# Patient Record
Sex: Female | Born: 1980 | Race: White | Hispanic: No | Smoking: Former smoker
Health system: Southern US, Community
[De-identification: ages and names within clinical notes are randomized; demographics above are authoritative.]

## PROBLEM LIST (undated history)

## (undated) DIAGNOSIS — F17200 Nicotine dependence, unspecified, uncomplicated: Secondary | ICD-10-CM

## (undated) DIAGNOSIS — I2510 Atherosclerotic heart disease of native coronary artery without angina pectoris: Secondary | ICD-10-CM

## (undated) DIAGNOSIS — T7840XA Allergy, unspecified, initial encounter: Secondary | ICD-10-CM

## (undated) DIAGNOSIS — K900 Celiac disease: Secondary | ICD-10-CM

## (undated) DIAGNOSIS — I10 Essential (primary) hypertension: Secondary | ICD-10-CM

## (undated) HISTORY — DX: Atherosclerotic heart disease of native coronary artery without angina pectoris: I25.10

## (undated) HISTORY — DX: Essential (primary) hypertension: I10

## (undated) HISTORY — DX: Allergy, unspecified, initial encounter: T78.40XA

## (undated) HISTORY — DX: Celiac disease: K90.0

---

## 1998-06-18 ENCOUNTER — Emergency Department (HOSPITAL_COMMUNITY): Admission: EM | Admit: 1998-06-18 | Discharge: 1998-06-18 | Payer: Self-pay | Admitting: Emergency Medicine

## 1998-07-15 ENCOUNTER — Ambulatory Visit (HOSPITAL_COMMUNITY): Admission: RE | Admit: 1998-07-15 | Discharge: 1998-07-15 | Payer: Self-pay | Admitting: *Deleted

## 1998-07-20 ENCOUNTER — Inpatient Hospital Stay (HOSPITAL_COMMUNITY): Admission: AD | Admit: 1998-07-20 | Discharge: 1998-07-20 | Payer: Self-pay | Admitting: Obstetrics & Gynecology

## 1998-10-15 ENCOUNTER — Inpatient Hospital Stay (HOSPITAL_COMMUNITY): Admission: AD | Admit: 1998-10-15 | Discharge: 1998-10-15 | Payer: Self-pay | Admitting: *Deleted

## 1998-12-09 ENCOUNTER — Inpatient Hospital Stay (HOSPITAL_COMMUNITY): Admission: AD | Admit: 1998-12-09 | Discharge: 1998-12-11 | Payer: Self-pay | Admitting: Obstetrics

## 1998-12-14 ENCOUNTER — Encounter (HOSPITAL_COMMUNITY): Admission: RE | Admit: 1998-12-14 | Discharge: 1999-03-14 | Payer: Self-pay | Admitting: Obstetrics

## 2000-04-12 ENCOUNTER — Emergency Department (HOSPITAL_COMMUNITY): Admission: EM | Admit: 2000-04-12 | Discharge: 2000-04-12 | Payer: Self-pay | Admitting: Emergency Medicine

## 2000-04-12 ENCOUNTER — Encounter: Payer: Self-pay | Admitting: Emergency Medicine

## 2000-05-20 ENCOUNTER — Emergency Department (HOSPITAL_COMMUNITY): Admission: EM | Admit: 2000-05-20 | Discharge: 2000-05-20 | Payer: Self-pay | Admitting: Emergency Medicine

## 2000-08-07 ENCOUNTER — Emergency Department (HOSPITAL_COMMUNITY): Admission: EM | Admit: 2000-08-07 | Discharge: 2000-08-07 | Payer: Self-pay | Admitting: Emergency Medicine

## 2000-09-01 ENCOUNTER — Emergency Department (HOSPITAL_COMMUNITY): Admission: EM | Admit: 2000-09-01 | Discharge: 2000-09-02 | Payer: Self-pay | Admitting: Emergency Medicine

## 2001-05-06 ENCOUNTER — Emergency Department (HOSPITAL_COMMUNITY): Admission: EM | Admit: 2001-05-06 | Discharge: 2001-05-06 | Payer: Self-pay | Admitting: Emergency Medicine

## 2004-02-14 ENCOUNTER — Emergency Department (HOSPITAL_COMMUNITY): Admission: EM | Admit: 2004-02-14 | Discharge: 2004-02-14 | Payer: Self-pay | Admitting: Emergency Medicine

## 2005-09-04 ENCOUNTER — Emergency Department (HOSPITAL_COMMUNITY): Admission: EM | Admit: 2005-09-04 | Discharge: 2005-09-04 | Payer: Self-pay | Admitting: Family Medicine

## 2006-05-23 ENCOUNTER — Emergency Department (HOSPITAL_COMMUNITY): Admission: EM | Admit: 2006-05-23 | Discharge: 2006-05-23 | Payer: Self-pay | Admitting: Emergency Medicine

## 2007-07-20 ENCOUNTER — Emergency Department (HOSPITAL_COMMUNITY): Admission: EM | Admit: 2007-07-20 | Discharge: 2007-07-20 | Payer: Self-pay | Admitting: Family Medicine

## 2007-07-22 ENCOUNTER — Emergency Department (HOSPITAL_COMMUNITY): Admission: EM | Admit: 2007-07-22 | Discharge: 2007-07-22 | Payer: Self-pay | Admitting: Emergency Medicine

## 2008-01-07 ENCOUNTER — Ambulatory Visit (HOSPITAL_COMMUNITY): Payer: Self-pay | Admitting: Marriage and Family Therapist

## 2008-01-11 ENCOUNTER — Ambulatory Visit (HOSPITAL_COMMUNITY): Payer: Self-pay | Admitting: Psychiatry

## 2008-02-01 ENCOUNTER — Ambulatory Visit (HOSPITAL_COMMUNITY): Payer: Self-pay | Admitting: Psychiatry

## 2008-02-05 ENCOUNTER — Ambulatory Visit (HOSPITAL_COMMUNITY): Payer: Self-pay | Admitting: Marriage and Family Therapist

## 2008-03-31 ENCOUNTER — Ambulatory Visit (HOSPITAL_COMMUNITY): Payer: Self-pay | Admitting: Psychiatry

## 2008-04-08 ENCOUNTER — Ambulatory Visit (HOSPITAL_COMMUNITY): Payer: Self-pay | Admitting: Marriage and Family Therapist

## 2008-04-17 ENCOUNTER — Ambulatory Visit (HOSPITAL_COMMUNITY): Payer: Self-pay | Admitting: Marriage and Family Therapist

## 2008-05-16 ENCOUNTER — Ambulatory Visit (HOSPITAL_COMMUNITY): Payer: Self-pay | Admitting: Psychiatry

## 2008-06-13 ENCOUNTER — Ambulatory Visit (HOSPITAL_COMMUNITY): Payer: Self-pay | Admitting: Psychiatry

## 2008-08-13 ENCOUNTER — Ambulatory Visit (HOSPITAL_COMMUNITY): Payer: Self-pay | Admitting: Psychiatry

## 2008-09-15 ENCOUNTER — Ambulatory Visit (HOSPITAL_COMMUNITY): Payer: Self-pay | Admitting: Psychiatry

## 2008-10-06 ENCOUNTER — Ambulatory Visit (HOSPITAL_COMMUNITY): Payer: Self-pay | Admitting: Psychiatry

## 2009-01-02 ENCOUNTER — Ambulatory Visit (HOSPITAL_COMMUNITY): Payer: Self-pay | Admitting: Psychiatry

## 2009-01-23 ENCOUNTER — Ambulatory Visit (HOSPITAL_COMMUNITY): Payer: Self-pay | Admitting: Psychiatry

## 2009-02-04 ENCOUNTER — Ambulatory Visit (HOSPITAL_COMMUNITY): Payer: Self-pay | Admitting: Licensed Clinical Social Worker

## 2009-02-17 ENCOUNTER — Ambulatory Visit (HOSPITAL_COMMUNITY): Payer: Self-pay | Admitting: Licensed Clinical Social Worker

## 2009-03-25 ENCOUNTER — Ambulatory Visit (HOSPITAL_COMMUNITY): Payer: Self-pay | Admitting: Psychiatry

## 2009-06-24 ENCOUNTER — Ambulatory Visit (HOSPITAL_COMMUNITY): Payer: Self-pay | Admitting: Psychiatry

## 2010-04-05 ENCOUNTER — Ambulatory Visit: Payer: Self-pay | Admitting: Family Medicine

## 2011-08-11 LAB — CBC
HCT: 41.4
Hemoglobin: 14.2
MCHC: 34.3
MCV: 93.2
Platelets: 296
RBC: 4.45
RDW: 12.9
WBC: 9.1

## 2011-08-11 LAB — I-STAT 8, (EC8 V) (CONVERTED LAB)
Acid-Base Excess: 2
Chloride: 107
HCT: 45
Hemoglobin: 15.3 — ABNORMAL HIGH
Potassium: 3.3 — ABNORMAL LOW
Sodium: 141
TCO2: 24

## 2011-08-11 LAB — DIFFERENTIAL
Eosinophils Absolute: 0.4
Eosinophils Relative: 5
Lymphocytes Relative: 20
Lymphs Abs: 1.9
Monocytes Relative: 6
Neutrophils Relative %: 67

## 2011-08-11 LAB — POCT I-STAT CREATININE
Creatinine, Ser: 0.6
Operator id: 196461

## 2018-11-28 ENCOUNTER — Emergency Department (HOSPITAL_COMMUNITY)
Admission: EM | Admit: 2018-11-28 | Discharge: 2018-11-28 | Disposition: A | Discharge status: Home / Self Care | Payer: Self-pay | Attending: Emergency Medicine | Admitting: Emergency Medicine

## 2018-11-28 ENCOUNTER — Emergency Department (HOSPITAL_COMMUNITY)
Admission: EM | Admit: 2018-11-28 | Discharge: 2018-11-28 | Disposition: A | Discharge status: Home / Self Care | Payer: Self-pay

## 2018-11-28 DIAGNOSIS — L0291 Cutaneous abscess, unspecified: Secondary | ICD-10-CM

## 2018-11-28 DIAGNOSIS — L02214 Cutaneous abscess of groin: Secondary | ICD-10-CM | POA: Insufficient documentation

## 2018-11-28 MED ORDER — DOXYCYCLINE HYCLATE 100 MG PO CAPS: 100.0000 mg | ORAL_CAPSULE | Freq: Two times a day (BID) | ORAL | 0 refills | Status: DC

## 2018-11-28 NOTE — ED Triage Notes (Signed)
Patient c/o left groin abscess accompanied by "leg pain that shoots down to knee and back up to buttocks"

## 2018-11-28 NOTE — ED Provider Notes (Signed)
[facility] EMERGENCY DEPARTMENT Provider Note   CSN: [id number] Arrival date & time: 11/28/18  0636     History   Chief Complaint Chief Complaint  Patient presents with  . Abscess    left groin    HPI Nicole Orr is a 38 y.o. female.  The history is provided by the patient. No language interpreter was used.  Abscess  Location:  Pelvis Pelvic abscess location:  Groin Size:  2 Abscess quality: draining and warmth   Red streaking: no   Progression:  Worsening Chronicity:  New Relieved by:  Nothing Worsened by:  Nothing Ineffective treatments:  None tried Associated symptoms: no nausea   Pt complains of an abscess to left groin  No past medical history on file.  There are no active problems to display for this patient.      OB History   No obstetric history on file.      Home Medications    Prior to Admission medications   Medication Sig Start Date End Date Taking? Authorizing Provider  doxycycline (VIBRAMYCIN) 100 MG capsule Take 1 capsule (100 mg total) by mouth 2 (two) times daily. 11/28/18   Elson Areas, PA-C    Family History No family history on file.  Social History Social History   Tobacco Use  . Smoking status: Not on file  Substance Use Topics  . Alcohol use: Not on file  . Drug use: Not on file     Allergies   Ceclor [cefaclor] and Penicillins   Review of Systems Review of Systems  Gastrointestinal: Negative for nausea.  All other systems reviewed and are negative.    Physical Exam Updated Vital Signs BP (!) 187/95 (BP Location: Right Arm)   Pulse 98   Temp 97.9 F (36.6 C) (Oral)   Resp 18   SpO2 99%   Physical Exam Vitals signs and nursing note reviewed.  HENT:     Head: Normocephalic.  Neck:     Musculoskeletal: Normal range of motion.  Cardiovascular:     Rate and Rhythm: Normal rate.  Musculoskeletal: Normal range of motion.  Skin:    General: Skin is warm.     Comments: 2cm swollen  area left groin, open oozing green purulent drainage.   Neurological:     General: No focal deficit present.     Mental Status: She is alert.  Psychiatric:        Mood and Affect: Mood normal.      ED Treatments / Results  Labs (all labs ordered are listed, but only abnormal results are displayed) Labs Reviewed - No data to display  EKG None  Radiology No results found.  Procedures Procedures (including critical care time)  Medications Ordered in ED Medications - No data to display   Initial Impression / Assessment and Plan / ED Course  I have reviewed the triage vital signs and the nursing notes.  Pertinent labs & imaging results that were available during my care of the patient were reviewed by me and considered in my medical decision making (see chart for details).     MDM  Pt given rx for doxycycline.  Pt advised to soak area.  Tylenol for pain.  Return if any problems.   Final Clinical Impressions(s) / ED Diagnoses   Final diagnoses:  Abscess  Abscess of left groin    ED Discharge Orders         Ordered    doxycycline (VIBRAMYCIN) 100 MG capsule  2 times daily     11/28/18 0805        An After Visit Summary was printed and given to the patient.    Elson Areas, [place] 11/28/18 1007    Gerhard Munch, MD 11/28/18 [phone]

## 2018-11-28 NOTE — Discharge Instructions (Signed)
Return if any problems.

## 2019-11-14 ENCOUNTER — Ambulatory Visit: Payer: Medicaid Other | Attending: Internal Medicine

## 2019-11-14 DIAGNOSIS — Z20822 Contact with and (suspected) exposure to covid-19: Secondary | ICD-10-CM

## 2019-11-16 LAB — NOVEL CORONAVIRUS, NAA: SARS-CoV-2, NAA: NOT DETECTED

## 2021-11-06 ENCOUNTER — Inpatient Hospital Stay (HOSPITAL_COMMUNITY)
Admission: EM | Admit: 2021-11-06 | Discharge: 2021-11-09 | DRG: 246 | Disposition: A | Discharge status: Home / Self Care | Payer: Medicaid Other | Attending: Cardiology | Admitting: Cardiology

## 2021-11-06 ENCOUNTER — Encounter (HOSPITAL_COMMUNITY): Payer: Self-pay | Admitting: Cardiology

## 2021-11-06 ENCOUNTER — Encounter: Payer: Self-pay | Admitting: Cardiology

## 2021-11-06 ENCOUNTER — Inpatient Hospital Stay (HOSPITAL_COMMUNITY)
Admission: EM | Disposition: A | Discharge status: Home / Self Care | Payer: Self-pay | Source: Home / Self Care | Attending: Cardiology

## 2021-11-06 ENCOUNTER — Emergency Department (HOSPITAL_COMMUNITY): Payer: Medicaid Other

## 2021-11-06 DIAGNOSIS — F17210 Nicotine dependence, cigarettes, uncomplicated: Secondary | ICD-10-CM | POA: Diagnosis present

## 2021-11-06 DIAGNOSIS — E782 Mixed hyperlipidemia: Secondary | ICD-10-CM | POA: Diagnosis present

## 2021-11-06 DIAGNOSIS — Z20822 Contact with and (suspected) exposure to covid-19: Secondary | ICD-10-CM | POA: Diagnosis present

## 2021-11-06 DIAGNOSIS — I472 Ventricular tachycardia, unspecified: Secondary | ICD-10-CM | POA: Diagnosis not present

## 2021-11-06 DIAGNOSIS — I462 Cardiac arrest due to underlying cardiac condition: Secondary | ICD-10-CM | POA: Diagnosis present

## 2021-11-06 DIAGNOSIS — I469 Cardiac arrest, cause unspecified: Secondary | ICD-10-CM

## 2021-11-06 DIAGNOSIS — I4901 Ventricular fibrillation: Secondary | ICD-10-CM | POA: Diagnosis present

## 2021-11-06 DIAGNOSIS — Z881 Allergy status to other antibiotic agents status: Secondary | ICD-10-CM

## 2021-11-06 DIAGNOSIS — I2510 Atherosclerotic heart disease of native coronary artery without angina pectoris: Secondary | ICD-10-CM | POA: Diagnosis present

## 2021-11-06 DIAGNOSIS — R739 Hyperglycemia, unspecified: Secondary | ICD-10-CM | POA: Diagnosis present

## 2021-11-06 DIAGNOSIS — R0602 Shortness of breath: Secondary | ICD-10-CM

## 2021-11-06 DIAGNOSIS — R0902 Hypoxemia: Secondary | ICD-10-CM | POA: Diagnosis present

## 2021-11-06 DIAGNOSIS — Z880 Allergy status to penicillin: Secondary | ICD-10-CM

## 2021-11-06 DIAGNOSIS — I213 ST elevation (STEMI) myocardial infarction of unspecified site: Secondary | ICD-10-CM | POA: Diagnosis present

## 2021-11-06 DIAGNOSIS — I10 Essential (primary) hypertension: Secondary | ICD-10-CM | POA: Diagnosis present

## 2021-11-06 DIAGNOSIS — I2102 ST elevation (STEMI) myocardial infarction involving left anterior descending coronary artery: Secondary | ICD-10-CM | POA: Diagnosis present

## 2021-11-06 DIAGNOSIS — Z955 Presence of coronary angioplasty implant and graft: Secondary | ICD-10-CM

## 2021-11-06 HISTORY — PX: LEFT HEART CATH AND CORONARY ANGIOGRAPHY: CATH118249

## 2021-11-06 HISTORY — PX: CORONARY/GRAFT ACUTE MI REVASCULARIZATION: CATH118305

## 2021-11-06 HISTORY — DX: Nicotine dependence, unspecified, uncomplicated: F17.200

## 2021-11-06 LAB — LIPID PANEL
Cholesterol: 223 mg/dL — ABNORMAL HIGH (ref 0–200)
Cholesterol: 216 mg/dL — ABNORMAL HIGH (ref 0–200)
HDL: 30 mg/dL — ABNORMAL LOW (ref >40)
HDL: 32 mg/dL — ABNORMAL LOW (ref >40)
LDL Cholesterol: 161 mg/dL — ABNORMAL HIGH (ref 0–99)
LDL Cholesterol: 172 mg/dL — ABNORMAL HIGH (ref 0–99)
Total CHOL/HDL Ratio: 7.4 RATIO
Total CHOL/HDL Ratio: 6.8 RATIO
Triglycerides: 158 mg/dL — ABNORMAL HIGH (ref <150)
Triglycerides: 61 mg/dL (ref <150)
VLDL: 32 mg/dL (ref 0–40)
VLDL: 12 mg/dL (ref 0–40)

## 2021-11-06 LAB — CBC WITH DIFFERENTIAL/PLATELET
Abs Immature Granulocytes: 0.89 10*3/uL — ABNORMAL HIGH (ref 0.00–0.07)
Basophils Absolute: 0.1 10*3/uL (ref 0.0–0.1)
Basophils Relative: 1 %
Eosinophils Absolute: 0.2 10*3/uL (ref 0.0–0.5)
Eosinophils Relative: 1 %
HCT: 46.2 % — ABNORMAL HIGH (ref 36.0–46.0)
Hemoglobin: 13.9 g/dL (ref 12.0–15.0)
Immature Granulocytes: 4 %
Lymphocytes Relative: 23 %
Lymphs Abs: 5.4 10*3/uL — ABNORMAL HIGH (ref 0.7–4.0)
MCH: 28.4 pg (ref 26.0–34.0)
MCHC: 30.1 g/dL (ref 30.0–36.0)
MCV: 94.5 fL (ref 80.0–100.0)
Monocytes Absolute: 0.9 10*3/uL (ref 0.1–1.0)
Monocytes Relative: 4 %
Neutro Abs: 16.2 10*3/uL — ABNORMAL HIGH (ref 1.7–7.7)
Neutrophils Relative %: 67 %
Platelets: 385 10*3/uL (ref 150–400)
RBC: 4.89 MIL/uL (ref 3.87–5.11)
RDW: 15.6 % — ABNORMAL HIGH (ref 11.5–15.5)
WBC: 23.8 10*3/uL — ABNORMAL HIGH (ref 4.0–10.5)
nRBC: 0 % (ref 0.0–0.2)

## 2021-11-06 LAB — COMPREHENSIVE METABOLIC PANEL
ALT: 44 U/L (ref 0–44)
AST: 62 U/L — ABNORMAL HIGH (ref 15–41)
Albumin: 3.4 g/dL — ABNORMAL LOW (ref 3.5–5.0)
Alkaline Phosphatase: 84 U/L (ref 38–126)
Anion gap: 20 — ABNORMAL HIGH (ref 5–15)
BUN: 11 mg/dL (ref 6–20)
CO2: 12 mmol/L — ABNORMAL LOW (ref 22–32)
Calcium: 8.5 mg/dL — ABNORMAL LOW (ref 8.9–10.3)
Chloride: 102 mmol/L (ref 98–111)
Creatinine, Ser: 1.16 mg/dL — ABNORMAL HIGH (ref 0.44–1.00)
GFR, Estimated: >60 mL/min (ref >60)
Glucose, Bld: 340 mg/dL — ABNORMAL HIGH (ref 70–99)
Potassium: 3.5 mmol/L (ref 3.5–5.1)
Sodium: 134 mmol/L — ABNORMAL LOW (ref 135–145)
Total Bilirubin: 0.5 mg/dL (ref 0.3–1.2)
Total Protein: 6.5 g/dL (ref 6.5–8.1)

## 2021-11-06 LAB — TROPONIN I (HIGH SENSITIVITY)
Troponin I (High Sensitivity): 195 ng/L (ref <18)
Troponin I (High Sensitivity): >24000 ng/L (ref <18)

## 2021-11-06 LAB — CBC
HCT: 42 % (ref 36.0–46.0)
Hemoglobin: 13.6 g/dL (ref 12.0–15.0)
MCH: 29.2 pg (ref 26.0–34.0)
MCHC: 32.4 g/dL (ref 30.0–36.0)
MCV: 90.1 fL (ref 80.0–100.0)
Platelets: 347 10*3/uL (ref 150–400)
RBC: 4.66 MIL/uL (ref 3.87–5.11)
RDW: 15.4 % (ref 11.5–15.5)
WBC: 28 10*3/uL — ABNORMAL HIGH (ref 4.0–10.5)
nRBC: 0 % (ref 0.0–0.2)

## 2021-11-06 LAB — RESP PANEL BY RT-PCR (FLU A&B, COVID) ARPGX2
Influenza A by PCR: NEGATIVE
Influenza B by PCR: NEGATIVE
SARS Coronavirus 2 by RT PCR: NEGATIVE

## 2021-11-06 LAB — PROTIME-INR
INR: 1.1 (ref 0.8–1.2)
Prothrombin Time: 14.3 seconds (ref 11.4–15.2)

## 2021-11-06 LAB — HEMOGLOBIN A1C
Hgb A1c MFr Bld: 5.4 % (ref 4.8–5.6)
Mean Plasma Glucose: 108.28 mg/dL

## 2021-11-06 LAB — I-STAT BETA HCG BLOOD, ED (MC, WL, AP ONLY): I-stat hCG, quantitative: <5 m[IU]/mL (ref <5)

## 2021-11-06 LAB — CREATININE, SERUM
Creatinine, Ser: 0.86 mg/dL (ref 0.44–1.00)
GFR, Estimated: >60 mL/min (ref >60)

## 2021-11-06 LAB — MRSA NEXT GEN BY PCR, NASAL: MRSA by PCR Next Gen: NOT DETECTED

## 2021-11-06 LAB — APTT: aPTT: 28 seconds (ref 24–36)

## 2021-11-06 SURGERY — CORONARY/GRAFT ACUTE MI REVASCULARIZATION
Anesthesia: LOCAL

## 2021-11-06 MED ORDER — INSULIN ASPART 100 UNIT/ML IJ SOLN: 4.0000 [IU] | Freq: Three times a day (TID) | INTRAMUSCULAR | Status: DC

## 2021-11-06 MED ORDER — ORAL CARE MOUTH RINSE
15.0000 mL | Freq: Two times a day (BID) | OROMUCOSAL | Status: DC
  Administered 2021-11-06 – 2021-11-08 (×4): 15 mL via OROMUCOSAL

## 2021-11-06 MED ORDER — ASPIRIN EC 81 MG PO TBEC
81.0000 mg | DELAYED_RELEASE_TABLET | Freq: Every day | ORAL | Status: DC
  Administered 2021-11-07 – 2021-11-09 (×3): 81 mg via ORAL
  Filled 2021-11-06 (×3): qty 1

## 2021-11-06 MED ORDER — POTASSIUM CHLORIDE CRYS ER 20 MEQ PO TBCR
40.0000 meq | EXTENDED_RELEASE_TABLET | Freq: Two times a day (BID) | ORAL | Status: AC
  Administered 2021-11-06 – 2021-11-07 (×2): 40 meq via ORAL
  Filled 2021-11-06 (×2): qty 2

## 2021-11-06 MED ORDER — LABETALOL HCL 5 MG/ML IV SOLN: 10.0000 mg | INTRAVENOUS | Status: DC | PRN

## 2021-11-06 MED ORDER — SODIUM CHLORIDE 0.9% FLUSH
3.0000 mL | Freq: Two times a day (BID) | INTRAVENOUS | Status: DC
  Administered 2021-11-07 – 2021-11-08 (×4): 3 mL via INTRAVENOUS

## 2021-11-06 MED ORDER — FENTANYL CITRATE (PF) 100 MCG/2ML IJ SOLN
INTRAMUSCULAR | Status: AC
  Filled 2021-11-06: qty 2

## 2021-11-06 MED ORDER — NITROGLYCERIN 1 MG/10 ML FOR IR/CATH LAB
INTRA_ARTERIAL | Status: AC
  Filled 2021-11-06: qty 10

## 2021-11-06 MED ORDER — ONDANSETRON HCL 4 MG/2ML IJ SOLN
INTRAMUSCULAR | Status: DC | PRN
  Administered 2021-11-06: 4 mg via INTRAVENOUS

## 2021-11-06 MED ORDER — TICAGRELOR 90 MG PO TABS
ORAL_TABLET | ORAL | Status: DC | PRN
  Administered 2021-11-06: 180 mg via ORAL

## 2021-11-06 MED ORDER — INSULIN ASPART 100 UNIT/ML IJ SOLN
0.0000 [IU] | Freq: Three times a day (TID) | INTRAMUSCULAR | Status: DC
  Administered 2021-11-07: 3 [IU] via SUBCUTANEOUS
  Administered 2021-11-08: 2 [IU] via SUBCUTANEOUS

## 2021-11-06 MED ORDER — SODIUM CHLORIDE 0.9 % IV SOLN: INTRAVENOUS | Status: DC

## 2021-11-06 MED ORDER — NITROGLYCERIN 1 MG/10 ML FOR IR/CATH LAB
INTRA_ARTERIAL | Status: DC | PRN
  Administered 2021-11-06: 200 ug via INTRACORONARY

## 2021-11-06 MED ORDER — ASPIRIN 81 MG PO CHEW
324.0000 mg | CHEWABLE_TABLET | Freq: Once | ORAL | Status: AC
  Administered 2021-11-06: 162 mg via ORAL

## 2021-11-06 MED ORDER — ACETAMINOPHEN 325 MG PO TABS
650.0000 mg | ORAL_TABLET | ORAL | Status: DC | PRN
  Administered 2021-11-07 – 2021-11-09 (×11): 650 mg via ORAL
  Filled 2021-11-06 (×11): qty 2

## 2021-11-06 MED ORDER — FENTANYL CITRATE PF 50 MCG/ML IJ SOSY: 100.0000 ug | PREFILLED_SYRINGE | Freq: Once | INTRAMUSCULAR | Status: AC

## 2021-11-06 MED ORDER — TICAGRELOR 90 MG PO TABS
ORAL_TABLET | ORAL | Status: AC
  Filled 2021-11-06: qty 1

## 2021-11-06 MED ORDER — HEPARIN (PORCINE) IN NACL 1000-0.9 UT/500ML-% IV SOLN
INTRAVENOUS | Status: AC
  Filled 2021-11-06: qty 1000

## 2021-11-06 MED ORDER — CHLORHEXIDINE GLUCONATE CLOTH 2 % EX PADS
6.0000 | MEDICATED_PAD | Freq: Every day | CUTANEOUS | Status: DC
  Administered 2021-11-06: 6 via TOPICAL

## 2021-11-06 MED ORDER — TIROFIBAN HCL IN NACL 5-0.9 MG/100ML-% IV SOLN
INTRAVENOUS | Status: AC
  Filled 2021-11-06: qty 100

## 2021-11-06 MED ORDER — VERAPAMIL HCL 2.5 MG/ML IV SOLN
INTRAVENOUS | Status: DC | PRN
  Administered 2021-11-06: 10 mL via INTRA_ARTERIAL

## 2021-11-06 MED ORDER — METOPROLOL TARTRATE 5 MG/5ML IV SOLN
INTRAVENOUS | Status: DC | PRN
  Administered 2021-11-06: 2.5 mg via INTRAVENOUS

## 2021-11-06 MED ORDER — METOPROLOL TARTRATE 12.5 MG HALF TABLET
12.5000 mg | ORAL_TABLET | Freq: Two times a day (BID) | ORAL | Status: DC
  Administered 2021-11-06: 12.5 mg via ORAL
  Filled 2021-11-06 (×2): qty 1

## 2021-11-06 MED ORDER — LOSARTAN POTASSIUM 25 MG PO TABS
25.0000 mg | ORAL_TABLET | Freq: Every day | ORAL | Status: DC
  Administered 2021-11-07: 25 mg via ORAL
  Filled 2021-11-06 (×2): qty 1

## 2021-11-06 MED ORDER — FUROSEMIDE 10 MG/ML IJ SOLN
20.0000 mg | Freq: Two times a day (BID) | INTRAMUSCULAR | Status: DC
  Administered 2021-11-06 – 2021-11-08 (×5): 20 mg via INTRAVENOUS
  Filled 2021-11-06 (×5): qty 2

## 2021-11-06 MED ORDER — LIDOCAINE HCL (PF) 1 % IJ SOLN
INTRAMUSCULAR | Status: DC | PRN
  Administered 2021-11-06: 2 mL via INTRADERMAL

## 2021-11-06 MED ORDER — AMIODARONE HCL IN DEXTROSE 360-4.14 MG/200ML-% IV SOLN
30.0000 mg/h | INTRAVENOUS | Status: DC
  Administered 2021-11-07: 30 mg/h via INTRAVENOUS
  Filled 2021-11-06 (×2): qty 200

## 2021-11-06 MED ORDER — LIDOCAINE HCL (PF) 1 % IJ SOLN
INTRAMUSCULAR | Status: AC
  Filled 2021-11-06: qty 30

## 2021-11-06 MED ORDER — MIDAZOLAM HCL 2 MG/2ML IJ SOLN
INTRAMUSCULAR | Status: AC
  Filled 2021-11-06: qty 2

## 2021-11-06 MED ORDER — HEPARIN SODIUM (PORCINE) 1000 UNIT/ML IJ SOLN
INTRAMUSCULAR | Status: DC | PRN
  Administered 2021-11-06: 6000 [IU] via INTRAVENOUS
  Administered 2021-11-06: 3000 [IU] via INTRAVENOUS

## 2021-11-06 MED ORDER — ATORVASTATIN CALCIUM 80 MG PO TABS
80.0000 mg | ORAL_TABLET | Freq: Every day | ORAL | Status: DC
  Administered 2021-11-06 – 2021-11-09 (×4): 80 mg via ORAL
  Filled 2021-11-06 (×4): qty 1

## 2021-11-06 MED ORDER — ONDANSETRON HCL 4 MG/2ML IJ SOLN
INTRAMUSCULAR | Status: AC
  Filled 2021-11-06: qty 2

## 2021-11-06 MED ORDER — AMIODARONE HCL IN DEXTROSE 360-4.14 MG/200ML-% IV SOLN
60.0000 mg/h | INTRAVENOUS | Status: DC
  Administered 2021-11-06 – 2021-11-07 (×2): 60 mg/h via INTRAVENOUS

## 2021-11-06 MED ORDER — MORPHINE SULFATE (PF) 2 MG/ML IV SOLN
1.0000 mg | Freq: Once | INTRAVENOUS | Status: AC
Start: 2021-11-06 — End: 2021-11-06

## 2021-11-06 MED ORDER — SODIUM CHLORIDE 0.9% FLUSH: 3.0000 mL | INTRAVENOUS | Status: DC | PRN

## 2021-11-06 MED ORDER — TICAGRELOR 90 MG PO TABS
90.0000 mg | ORAL_TABLET | Freq: Two times a day (BID) | ORAL | Status: DC
  Administered 2021-11-07 – 2021-11-09 (×5): 90 mg via ORAL
  Filled 2021-11-06 (×5): qty 1

## 2021-11-06 MED ORDER — FENTANYL CITRATE PF 50 MCG/ML IJ SOSY
PREFILLED_SYRINGE | INTRAMUSCULAR | Status: AC
  Administered 2021-11-06: 100 ug via INTRAVENOUS
  Filled 2021-11-06: qty 2

## 2021-11-06 MED ORDER — METOPROLOL TARTRATE 5 MG/5ML IV SOLN
INTRAVENOUS | Status: AC
  Filled 2021-11-06: qty 5

## 2021-11-06 MED ORDER — HEPARIN SODIUM (PORCINE) 1000 UNIT/ML IJ SOLN
INTRAMUSCULAR | Status: AC
  Filled 2021-11-06: qty 10

## 2021-11-06 MED ORDER — IOHEXOL 350 MG/ML SOLN
INTRAVENOUS | Status: DC | PRN
  Administered 2021-11-06: 180 mL via INTRA_ARTERIAL

## 2021-11-06 MED ORDER — SODIUM CHLORIDE 0.9 % IV SOLN: 250.0000 mL | INTRAVENOUS | Status: DC | PRN

## 2021-11-06 MED ORDER — VERAPAMIL HCL 2.5 MG/ML IV SOLN
INTRAVENOUS | Status: AC
  Filled 2021-11-06: qty 2

## 2021-11-06 MED ORDER — MORPHINE SULFATE (PF) 2 MG/ML IV SOLN
INTRAVENOUS | Status: AC
  Administered 2021-11-06: 1 mg via INTRAVENOUS
  Filled 2021-11-06: qty 1

## 2021-11-06 MED ORDER — INSULIN ASPART 100 UNIT/ML IJ SOLN
0.0000 [IU] | Freq: Every day | INTRAMUSCULAR | Status: DC
  Administered 2021-11-06: 3 [IU] via SUBCUTANEOUS

## 2021-11-06 MED ORDER — HEPARIN SODIUM (PORCINE) 5000 UNIT/ML IJ SOLN
5000.0000 [IU] | Freq: Three times a day (TID) | INTRAMUSCULAR | Status: DC
  Administered 2021-11-07 – 2021-11-09 (×7): 5000 [IU] via SUBCUTANEOUS
  Filled 2021-11-06 (×7): qty 1

## 2021-11-06 MED ORDER — ONDANSETRON HCL 4 MG/2ML IJ SOLN
4.0000 mg | Freq: Four times a day (QID) | INTRAMUSCULAR | Status: DC | PRN
  Administered 2021-11-07 (×3): 4 mg via INTRAVENOUS
  Filled 2021-11-06 (×4): qty 2

## 2021-11-06 MED ORDER — TIROFIBAN (AGGRASTAT) BOLUS VIA INFUSION
INTRAVENOUS | Status: DC | PRN
  Administered 2021-11-06: 2267.5 ug via INTRAVENOUS

## 2021-11-06 MED ORDER — HEPARIN SODIUM (PORCINE) 5000 UNIT/ML IJ SOLN
4000.0000 [IU] | Freq: Once | INTRAMUSCULAR | Status: AC
  Administered 2021-11-06: 4000 [IU] via INTRAVENOUS

## 2021-11-06 SURGICAL SUPPLY — 26 items
BALLN EUPHORA RX 2.5X12 (BALLOONS) ×2
BALLN EUPHORA RX 2.5X20 (BALLOONS) ×2
BALLN SAPPHIRE ~~LOC~~ 3.0X18 (BALLOONS) ×1 IMPLANT
BALLOON EUPHORA RX 2.5X12 (BALLOONS) IMPLANT
BALLOON EUPHORA RX 2.5X20 (BALLOONS) IMPLANT
CATH 5FR JL3.5 JR4 ANG PIG MP (CATHETERS) ×1 IMPLANT
CATH EXTRAC PRONTO LP 6F RND (CATHETERS) ×1 IMPLANT
CATH INFINITI 5 FR 3DRC (CATHETERS) ×1 IMPLANT
CATH INFINITI 5 FR JR3.5 (CATHETERS) ×1 IMPLANT
CATH VISTA GUIDE 6FR JR4 (CATHETERS) ×1 IMPLANT
CATH VISTA GUIDE 6FR XB3 (CATHETERS) ×1 IMPLANT
DEVICE RAD COMP TR BAND LRG (VASCULAR PRODUCTS) ×1 IMPLANT
GLIDESHEATH SLEND SS 6F .021 (SHEATH) ×1 IMPLANT
GUIDEWIRE INQWIRE 1.5J.035X260 (WIRE) IMPLANT
INQWIRE 1.5J .035X260CM (WIRE) ×2
KIT ESSENTIALS PG (KITS) ×1 IMPLANT
KIT HEART LEFT (KITS) ×2 IMPLANT
KIT HEMO VALVE WATCHDOG (MISCELLANEOUS) ×1 IMPLANT
PACK CARDIAC CATHETERIZATION (CUSTOM PROCEDURE TRAY) ×3 IMPLANT
STENT ONYX FRONTIER 2.75X34 (Permanent Stent) ×1 IMPLANT
SYR MEDRAD MARK 7 150ML (SYRINGE) ×3 IMPLANT
TRANSDUCER W/STOPCOCK (MISCELLANEOUS) ×3 IMPLANT
TUBING CIL FLEX 10 FLL-RA (TUBING) ×3 IMPLANT
WIRE ASAHI PROWATER 180CM (WIRE) ×1 IMPLANT
WIRE HI TORQ BMW 190CM (WIRE) ×1 IMPLANT
WIRE HI TORQ WHISPER MS 190CM (WIRE) ×1 IMPLANT

## 2021-11-06 NOTE — Progress Notes (Signed)
Chaplain Benetta Spar provided a follow up visit to a 41 y.o. female patient that had cardiac arrest earlier today. Patient just came from CV Cath Lab and settled into room. Daughter Liborio Nixon and patients mother at bedside. Patient is anxious and frightened. Provided healing prayer for patient at her request. Told patient and family that I would be available for further emotional and spiritual support and would stop by later to see how she was progressing.

## 2021-11-06 NOTE — ED Provider Notes (Signed)
[facility] EMERGENCY DEPARTMENT Provider Note   CSN: [id number] Arrival date & time: 11/06/21  1837     History  Chief Complaint  Patient presents with   Cardiac Arrest    Nicole Orr is a 41 y.o. female.  HPI 41 yo female presents via ems as code stemi.  Reports chest pain earlier today.  She was at work when she had a witnessed arrest.  Coworker started CPR.  Fire was first on scene and patient was in V. fib.  She received amiodarone, defibrillated x4 epinephrine x1, return of spontaneous circulation elation and patient has been awake and alert since that time.  She received total 12 minutes of CPR.  Patient is complaining of ongoing chest pain.  No previous significant past medical history noted patient is a smoker    Home Medications Prior to Admission medications   Medication Sig Start Date End Date Taking? Authorizing Provider  doxycycline (VIBRAMYCIN) 100 MG capsule Take 1 capsule (100 mg total) by mouth 2 (two) times daily. 11/28/18   Elson Areas, PA-C      Allergies    Ceclor [cefaclor] and Penicillins    Review of Systems   Review of Systems  All other systems reviewed and are negative.  Physical Exam Updated Vital Signs There were no vitals taken for this visit. Physical Exam Vitals and nursing note reviewed.  Constitutional:      General: She is not in acute distress.    Appearance: She is normal weight. She is not ill-appearing.  HENT:     Head: Normocephalic.     Right Ear: External ear normal.     Left Ear: External ear normal.     Nose: Nose normal.     Mouth/Throat:     Pharynx: Oropharynx is clear.  Eyes:     Extraocular Movements: Extraocular movements intact.     Pupils: Pupils are equal, round, and reactive to light.  Cardiovascular:     Rate and Rhythm: Normal rate and regular rhythm.     Pulses: Normal pulses.  Pulmonary:     Effort: Pulmonary effort is normal.  Abdominal:     General: Abdomen is flat. Bowel  sounds are normal.  Musculoskeletal:        General: Normal range of motion.     Cervical back: Normal range of motion.     Comments: Io left tibia  Skin:    General: Skin is warm and dry.     Capillary Refill: Capillary refill takes less than 2 seconds.  Neurological:     General: No focal deficit present.     Mental Status: She is alert.  Psychiatric:        Mood and Affect: Mood normal.    ED Results / Procedures / Treatments   Labs (all labs ordered are listed, but only abnormal results are displayed) Labs Reviewed  RESP PANEL BY RT-PCR (FLU A&B, COVID) ARPGX2  HEMOGLOBIN A1C  CBC WITH DIFFERENTIAL/PLATELET  PROTIME-INR  APTT  COMPREHENSIVE METABOLIC PANEL  LIPID PANEL  I-STAT BETA HCG BLOOD, ED (MC, [facility], AP ONLY)  TROPONIN I (HIGH SENSITIVITY)    EKG None  Radiology DG Chest Port 1 View  Result Date: 11/06/2021 CLINICAL DATA:  Status post arrest.  CPR. EXAM: PORTABLE CHEST 1 VIEW COMPARISON:  July 22, 2007 FINDINGS: No pneumothorax. The cardiomediastinal silhouette is stable given portable technique. Mild increased interstitial markings. No focal infiltrate. No nodule or mass. No other acute abnormalities. IMPRESSION: Mild  increased interstitial markings is most consistent with mild edema given history. No other abnormalities. Electronically Signed   By: Gerome Sam III M.D.   On: 11/06/2021 19:24    Procedures .Critical Care Performed by: Margarita Grizzle, MD Authorized by: Margarita Grizzle, MD   Critical care provider statement:    Critical care time (minutes):  45   Critical care end time:  11/06/2021 7:31 PM   Critical care was necessary to treat or prevent imminent or life-threatening deterioration of the following conditions:  Circulatory failure and cardiac failure    Medications Ordered in ED Medications  0.9 %  sodium chloride infusion (has no administration in time range)  amiodarone (NEXTERONE PREMIX) 360-4.14 MG/200ML-% (1.8 mg/mL) IV infusion (60  mg/hr Intravenous New Bag/Given 11/06/21 1857)  amiodarone (NEXTERONE PREMIX) 360-4.14 MG/200ML-% (1.8 mg/mL) IV infusion (has no administration in time range)  aspirin chewable tablet 324 mg (162 mg Oral Given 11/06/21 1850)  heparin injection 4,000 Units (4,000 Units Intravenous Given 11/06/21 1850)  morphine 2 MG/ML injection 1 mg (1 mg Intravenous Given 11/06/21 1917)  fentaNYL (SUBLIMAZE) injection 100 mcg (100 mcg Intravenous Given 11/06/21 1914)    ED Course/ Medical Decision Making/ A&P Clinical Course as of 11/06/21 1934  Sat Nov 06, 2021  1854 Vss Patient complaining of chest pain Morphine being given [DR]  1855 Cardiology fellow was at bedside and going to cath lab to check status of current intervention.   [DR]    Clinical Course User Index [DR] Margarita Grizzle, MD                           Medical Decision Making Amount and/or Complexity of Data Reviewed Independent Historian: EMS    Details: patient at work and collapsed initial rhythm v fib defib x 4 epi amiodarone 300 mg 12 minutes of cpr with rosc patient alert and oriented post cpr External Data Reviewed: ECG.    Details: prehospital ekg v fib then st elevation v2-v4 Labs: ordered. Decision-making details documented in ED Course. ECG/medicine tests:  Decision-making details documented in ED Course. Discussion of management or test interpretation with external provider(s): EKG here with STEMI Iv heparin and amiodarone infusion started 2 baby asa- patient reports taking 2 before work Discussed care with Dr. Welton Flakes Discussed care with Dr. Olena Leatherwood delay in patient going to Cath Lab due to current patient on the table. I remained at bedside and continuously evaluated by patient dynamic status.  Risk OTC drugs. Parenteral controlled substances. Risk Details: Patient presents postarrest with ST elevation MI Patient received acute interventions of heparin, amiodarone, and remainder of aspirin dose here in the ED.   Patient was monitored closely.  She remained in a sinus rhythm here in the ED. Discussion with cardiology x2 patient taken emergently to Cath Lab.  Critical Care Total time providing critical care: 30-74 minutes          Final Clinical Impression(s) / ED Diagnoses Final diagnoses:  None    Rx / DC Orders ED Discharge Orders     None         Margarita Grizzle, MD 11/06/21 Barry Brunner

## 2021-11-06 NOTE — ED Triage Notes (Signed)
Pt here via [facility] after witnessed arrest.  Pt had c/o chest pain earlier in the day and had taken 2 baby asa before going to work at 6 am.  Pt stated she "felt funny".  Co-workers at immediately began cpr.  When EMS arrived pt was in V-FIB.  Given 4 shocks, 1 epi and 300 amio.  ROSC after 12 min.  Pt ao x 4, though repetative questioning.

## 2021-11-06 NOTE — H&P (Signed)
Nicole Orr is an 41 y.o. female.   Chief Complaint: STEMI, [facility] HPI:   41 y.o. Caucasian female , 1 [employer] smoker, with out of hospital cardiac arrest.  Nicole Orr reportedly had chest pain earlier in the day, took an aspirin and went to work.  She had a witnessed cardiac arrest with bystander CPR.  EMS arrived and found her to be in VF, shocked 4 times, initial rhythm v fib, defib x 4, epi, amiodarone 300 mg, 12 minutes of cpr with rosc. Nicole Orr alert and oriented post [place] achieved.  Nicole Orr currently is awake and alert, complains of severe chest pain.  EKG shows anterolateral ST elevation. Delay in bringing Nicole Orr to the cath lab due ongoing STEMI in the lab and requiring standby STEMI tea to be called in including the myself.   Past Medical History:  Diagnosis Date   Tobacco dependence     History reviewed. No pertinent surgical history.  History reviewed. No pertinent family history.  Social History:  reports that she has been smoking cigarettes. She has been smoking an average of 1 pack per day. She does not have any smokeless tobacco history on file. No history on file for alcohol use and drug use.  Allergies:  Allergies  Allergen Reactions   Ceclor [Cefaclor]    Penicillins     Review of Systems  Constitutional: Negative for decreased appetite, malaise/fatigue, weight gain and weight loss.  HENT:  Negative for congestion.   Eyes:  Negative for visual disturbance.  Cardiovascular:  Positive for chest pain (Resolved post PCI). Negative for dyspnea on exertion, leg swelling, palpitations and syncope.  Respiratory:  Negative for cough.   Endocrine: Negative for cold intolerance.  Hematologic/Lymphatic: Does not bruise/bleed easily.  Skin:  Negative for itching and rash.  Musculoskeletal:  Negative for myalgias.  Gastrointestinal:  Negative for abdominal pain, nausea and vomiting.  Genitourinary:  Negative for dysuria.  Neurological:  Negative for dizziness and weakness.   Psychiatric/Behavioral:  The Nicole Orr is not nervous/anxious.   All other systems reviewed and are negative.  Vitals pending. Reportedly hemod   Physical Exam Vitals and nursing note reviewed.  Constitutional:      General: She is in acute distress.     Appearance: She is well-developed.  HENT:     Head: Normocephalic and atraumatic.  Eyes:     Conjunctiva/sclera: Conjunctivae normal.     Pupils: Pupils are equal, round, and reactive to light.  Neck:     Vascular: No JVD.  Cardiovascular:     Rate and Rhythm: Normal rate and regular rhythm.     Pulses: Normal pulses and intact distal pulses.     Heart sounds: No murmur heard. Pulmonary:     Effort: Pulmonary effort is normal.     Breath sounds: Normal breath sounds. No wheezing or rales.  Abdominal:     General: Bowel sounds are normal.     Palpations: Abdomen is soft.     Tenderness: There is no rebound.  Musculoskeletal:        General: No tenderness. Normal range of motion.     Right lower leg: No edema.     Left lower leg: No edema.  Lymphadenopathy:     Cervical: No cervical adenopathy.  Skin:    General: Skin is warm and dry.  Neurological:     Mental Status: She is alert and oriented to person, place, and time.     Cranial Nerves: No cranial nerve deficit.    Results  for orders placed or performed during the hospital encounter of 11/06/21 (from the past 48 hour(s))  I-Stat beta hCG blood, ED     Status: None   Collection Time: 11/06/21  6:57 PM  Result Value Ref Range   I-stat hCG, quantitative <5.0 <5 mIU/mL   Comment 3            Comment:   GEST. AGE      CONC.  (mIU/mL)   <=1 WEEK        5 - 50     2 WEEKS       50 - 500     3 WEEKS       100 - 10,000     4 WEEKS     1,000 - 30,000        FEMALE AND NON-PREGNANT FEMALE:     LESS THAN 5 mIU/mL     Labs:   Lab Results  Component Value Date   WBC 9.1 07/22/2007   HGB 15.3 (H) 07/22/2007   HCT 45.0 07/22/2007   MCV 93.2 07/22/2007   PLT 296  07/22/2007   No results for input(s): NA, K, CL, CO2, BUN, CREATININE, CALCIUM, PROT, BILITOT, ALKPHOS, ALT, AST, GLUCOSE in the last 168 hours.  Invalid input(s): LABALBU  Lipid Panel     Component Value Date/Time   CHOL 223 (H) 11/06/2021 1851   TRIG 158 (H) 11/06/2021 1851   HDL 30 (L) 11/06/2021 1851   CHOLHDL 7.4 11/06/2021 1851   VLDL 32 11/06/2021 1851   LDLCALC 161 (H) 11/06/2021 1851    BNP (last 3 results) Pending  HEMOGLOBIN A1C Pending  Cardiac Panel (last 3 results)  Latest Reference Range & Units 11/06/21 18:51  Troponin I (High Sensitivity) <18 ng/L 195 (HH)  (HH): Data is critically high       Current Facility-Administered Medications:    0.9 %  sodium chloride infusion, , Intravenous, Continuous, Ray, Danielle, MD   amiodarone (NEXTERONE PREMIX) 360-4.14 MG/200ML-% (1.8 mg/mL) IV infusion, 60 mg/hr, Intravenous, Continuous, Ray, Danielle, MD, Last Rate: 33.3 mL/hr at 11/06/21 1857, 60 mg/hr at 11/06/21 1857   [START ON 11/07/2021] amiodarone (NEXTERONE PREMIX) 360-4.14 MG/200ML-% (1.8 mg/mL) IV infusion, 30 mg/hr, Intravenous, Continuous, Ray, Andee Poles, MD  Current Outpatient Medications:    doxycycline (VIBRAMYCIN) 100 MG capsule, Take 1 capsule (100 mg total) by mouth 2 (two) times daily., Disp: 20 capsule, Rfl: 0   Vitals:   11/06/21 2110 11/06/21 2134  BP:  129/81  Pulse: 81 81  Resp: 15 17  SpO2: 95% 93%       CARDIAC STUDIES:  Coronary intervention 11/06/2021: LM: Normal LAD: Mid LAD occlusion (culprit)         Distal LAD tandem 80% stenoses (non-culprit) LCX: No significant disease RCA: Dominant vessel. Distal RPDA 70% disease   Hypertensive 200/100 mmHg with LVEDP 34 mmHg at the beginning of the case BP improved to 150/90 mmHg after relief from chest pain   Reperfusion rhythm noted after revascularization, without any ventricular arrhythmia   Successful percutaneous coronary intervention mLAD        Aspiration throbectomy         PTCA and stent placement 2.75 X 34 mm Onyx drug-eluting stent        Post dilatation with 3.0X18 mm Bleckley balloon at 18 atm  EKG 11/06/2021: Sinus rhythm Acute anterolateral injury  Echocardiogram: Pending    Assessment/Plan  41 y.o. Caucasian female, smoker, new diagnosis mixed hyperlipidemia, hyperglycemia, with  out of hospital cardiac arrest w/VF, anterolateral STEMI  [facility], anterolateral STEMI: Culprit vessel mid LAD with primary PCI  Aspiration throbectomy        PTCA and stent placement 2.75 X 34 mm Onyx drug-eluting stent        Post dilatation with 3.0X18 mm Howe balloon at 18 atm Received Aggrastat bolus in cath lab Residual dLAD and dRPDA disease best treated medically, especially in light of elevated LVEDP and cardiac arrest No further VF. Can discontinue Amiodarone. DAPT with Aspirin/Brilinta Lipitor 80 mg Metoprolol tartrate 12.5 mg bid, losartan 25 mg daily to start in the morning.  Echocardiogram pending. Expect anterolateral hypokinesis. If EF low, will change losartan to Entresto Smoking cessation A1C, lipoprotein (a) pending  Mixed hyperlipidemia: New diagnosis. Chol 223, LDL 161 Lipitor 80 mg for now. May need Repatha  Hyperglycemia: Could be stress reaction. Insulin converge. A1C pending.   Nigel Mormon, MD Pager: [phone] Office: [phone]

## 2021-11-07 ENCOUNTER — Other Ambulatory Visit (HOSPITAL_COMMUNITY): Payer: Medicaid Other

## 2021-11-07 ENCOUNTER — Encounter (HOSPITAL_COMMUNITY): Payer: Self-pay | Admitting: Cardiology

## 2021-11-07 ENCOUNTER — Other Ambulatory Visit: Payer: Self-pay

## 2021-11-07 ENCOUNTER — Inpatient Hospital Stay (HOSPITAL_COMMUNITY): Payer: Medicaid Other

## 2021-11-07 LAB — ECHOCARDIOGRAM COMPLETE
AR max vel: 2.13 cm2
AV Area VTI: 2.15 cm2
AV Area mean vel: 2.01 cm2
AV Mean grad: 3 mmHg
AV Peak grad: 6.2 mmHg
Ao pk vel: 1.24 m/s
Area-P 1/2: 4.8 cm2
Calc EF: 52.9 %
Height: 64.488 in
S' Lateral: 2.7 cm
Single Plane A2C EF: 43.9 %
Single Plane A4C EF: 58.4 %
Weight: 3200 oz

## 2021-11-07 LAB — BASIC METABOLIC PANEL
Anion gap: 10 (ref 5–15)
BUN: 11 mg/dL (ref 6–20)
CO2: 20 mmol/L — ABNORMAL LOW (ref 22–32)
Calcium: 8.5 mg/dL — ABNORMAL LOW (ref 8.9–10.3)
Chloride: 103 mmol/L (ref 98–111)
Creatinine, Ser: 0.8 mg/dL (ref 0.44–1.00)
GFR, Estimated: >60 mL/min (ref >60)
Glucose, Bld: 145 mg/dL — ABNORMAL HIGH (ref 70–99)
Potassium: 4.1 mmol/L (ref 3.5–5.1)
Sodium: 133 mmol/L — ABNORMAL LOW (ref 135–145)

## 2021-11-07 LAB — BRAIN NATRIURETIC PEPTIDE: B Natriuretic Peptide: 128.1 pg/mL — ABNORMAL HIGH (ref 0.0–100.0)

## 2021-11-07 LAB — CBC
HCT: 38.4 % (ref 36.0–46.0)
Hemoglobin: 12.7 g/dL (ref 12.0–15.0)
MCH: 29.1 pg (ref 26.0–34.0)
MCHC: 33.1 g/dL (ref 30.0–36.0)
MCV: 87.9 fL (ref 80.0–100.0)
Platelets: 319 10*3/uL (ref 150–400)
RBC: 4.37 MIL/uL (ref 3.87–5.11)
RDW: 15.5 % (ref 11.5–15.5)
WBC: 20.3 10*3/uL — ABNORMAL HIGH (ref 4.0–10.5)
nRBC: 0 % (ref 0.0–0.2)

## 2021-11-07 LAB — HEMOGLOBIN A1C
Hgb A1c MFr Bld: 5.4 % (ref 4.8–5.6)
Mean Plasma Glucose: 108.28 mg/dL

## 2021-11-07 LAB — GLUCOSE, CAPILLARY
Glucose-Capillary: 151 mg/dL — ABNORMAL HIGH (ref 70–99)
Glucose-Capillary: 118 mg/dL — ABNORMAL HIGH (ref 70–99)
Glucose-Capillary: 111 mg/dL — ABNORMAL HIGH (ref 70–99)
Glucose-Capillary: 96 mg/dL (ref 70–99)

## 2021-11-07 MED ORDER — SACUBITRIL-VALSARTAN 24-26 MG PO TABS
1.0000 | ORAL_TABLET | Freq: Two times a day (BID) | ORAL | Status: DC
  Filled 2021-11-07: qty 1

## 2021-11-07 MED ORDER — METOPROLOL SUCCINATE ER 25 MG PO TB24
25.0000 mg | ORAL_TABLET | Freq: Every day | ORAL | Status: DC
  Administered 2021-11-07: 25 mg via ORAL
  Filled 2021-11-07: qty 1

## 2021-11-07 MED ORDER — SACUBITRIL-VALSARTAN 24-26 MG PO TABS
1.0000 | ORAL_TABLET | Freq: Two times a day (BID) | ORAL | Status: DC
  Administered 2021-11-07: 1 via ORAL
  Filled 2021-11-07 (×2): qty 1

## 2021-11-07 MED ORDER — PERFLUTREN LIPID MICROSPHERE
1.0000 mL | INTRAVENOUS | Status: AC | PRN
  Administered 2021-11-07: 2 mL via INTRAVENOUS
  Filled 2021-11-07: qty 10

## 2021-11-07 MED ORDER — AMLODIPINE BESYLATE 5 MG PO TABS
5.0000 mg | ORAL_TABLET | Freq: Every day | ORAL | Status: DC
  Administered 2021-11-07 – 2021-11-09 (×3): 5 mg via ORAL
  Filled 2021-11-07 (×3): qty 1

## 2021-11-07 MED ORDER — SPIRONOLACTONE 25 MG PO TABS
25.0000 mg | ORAL_TABLET | Freq: Every day | ORAL | Status: DC
  Administered 2021-11-07 – 2021-11-09 (×3): 25 mg via ORAL
  Filled 2021-11-07 (×3): qty 1

## 2021-11-07 MED ORDER — TRAMADOL HCL 50 MG PO TABS
50.0000 mg | ORAL_TABLET | Freq: Four times a day (QID) | ORAL | Status: DC | PRN
  Administered 2021-11-07 – 2021-11-09 (×5): 50 mg via ORAL
  Filled 2021-11-07 (×6): qty 1

## 2021-11-07 MED ORDER — LOSARTAN POTASSIUM 50 MG PO TABS: 50.0000 mg | ORAL_TABLET | Freq: Every day | ORAL | Status: DC

## 2021-11-07 MED ORDER — LOSARTAN POTASSIUM 50 MG PO TABS
50.0000 mg | ORAL_TABLET | Freq: Every day | ORAL | Status: DC
  Administered 2021-11-08 – 2021-11-09 (×2): 50 mg via ORAL
  Filled 2021-11-07 (×3): qty 1

## 2021-11-07 MED ORDER — HYDRALAZINE HCL 20 MG/ML IJ SOLN
10.0000 mg | INTRAMUSCULAR | Status: DC | PRN
  Administered 2021-11-07: 10 mg via INTRAVENOUS

## 2021-11-07 MED ORDER — HYDRALAZINE HCL 20 MG/ML IJ SOLN
INTRAMUSCULAR | Status: AC
  Filled 2021-11-07: qty 1

## 2021-11-07 MED ORDER — METOPROLOL TARTRATE 12.5 MG HALF TABLET
12.5000 mg | ORAL_TABLET | Freq: Two times a day (BID) | ORAL | Status: DC
  Administered 2021-11-07: 12.5 mg via ORAL

## 2021-11-07 MED ORDER — METOPROLOL SUCCINATE ER 50 MG PO TB24
50.0000 mg | ORAL_TABLET | Freq: Every day | ORAL | Status: DC
  Administered 2021-11-08 – 2021-11-09 (×2): 50 mg via ORAL
  Filled 2021-11-07 (×3): qty 1

## 2021-11-07 MED ORDER — LOSARTAN POTASSIUM 25 MG PO TABS
25.0000 mg | ORAL_TABLET | Freq: Once | ORAL | Status: AC
  Administered 2021-11-07: 25 mg via ORAL

## 2021-11-07 NOTE — Plan of Care (Signed)
Patient progressing well.  Discussed several symptoms and risks associated with heart attacks.  Voices understanding and questions answered.

## 2021-11-07 NOTE — Progress Notes (Addendum)
°  Echocardiogram 2D Echocardiogram with contrast has been performed.  Roosvelt Maser F 11/07/2021, 4:04 PM

## 2021-11-07 NOTE — Care Management (Addendum)
11-07-21 1222 Case Manager received consult for [employer]. Patient is listed in Epic without insurance. Case Manager spoke with patients daughter and she verified that the patient is without insurance. Case Manager submitted clinicals to [facility]. Case Manager will schedule hospital follow up appointment for the patient before she leaves the hospital. Case Manager will also follow for MATCH. No further needs identified at this time.   11-07-21 1314 Received message from [facility] that the patient can be fit tomorrow.

## 2021-11-07 NOTE — Progress Notes (Addendum)
Subjective:  No presentation chest pain Has chest wall pain with moving, deep breathing, likely from CPR No bony injuries  NSVT/AIVR on monitor No sustained VT/VF  Objective:  Vital Signs in the last 24 hours: Temp:  [97.5 F (36.4 C)-97.6 F (36.4 C)] 97.6 F (36.4 C) (01/08 0745) Pulse Rate:  [0-161] 74 (01/08 0800) Resp:  [0-26] 19 (01/08 0800) BP: (110-205)/(74-125) 183/117 (01/08 0800) SpO2:  [0 %-99 %] 95 % (01/08 0800) Weight:  [90.7 kg] 90.7 kg (01/07 1950)  Intake/Output from previous day: 01/07 0701 - 01/08 0700 In: 290.9 [I.V.:290.9] Out: 1400 [Urine:1400]  Physical Exam Vitals and nursing note reviewed.  Constitutional:      General: She is not in acute distress.    Appearance: She is well-developed.  HENT:     Head: Normocephalic and atraumatic.  Eyes:     Conjunctiva/sclera: Conjunctivae normal.     Pupils: Pupils are equal, round, and reactive to light.  Neck:     Vascular: No JVD.  Cardiovascular:     Rate and Rhythm: Normal rate and regular rhythm.     Pulses: Normal pulses and intact distal pulses.     Heart sounds: No murmur heard. Pulmonary:     Effort: Pulmonary effort is normal.     Breath sounds: Normal breath sounds. No wheezing or rales.  Abdominal:     General: Bowel sounds are normal.     Palpations: Abdomen is soft.     Tenderness: There is no rebound.  Musculoskeletal:        General: No tenderness. Normal range of motion.     Right lower leg: No edema.     Left lower leg: No edema.  Lymphadenopathy:     Cervical: No cervical adenopathy.  Skin:    General: Skin is warm and dry.  Neurological:     Mental Status: She is alert and oriented to person, place, and time.     Cranial Nerves: No cranial nerve deficit.     Lab Results: BMP Recent Labs    11/06/21 1851 11/06/21 2231 11/07/21 0556  NA 134*  --  133*  K 3.5  --  4.1  CL 102  --  103  CO2 12*  --  20*  GLUCOSE 340*  --  145*  BUN 11  --  11  CREATININE 1.16*  0.86 0.80  CALCIUM 8.5*  --  8.5*  GFRNONAA >60 >60 >60    CBC Recent Labs  Lab 11/06/21 1851 11/06/21 2231 11/07/21 0556  WBC 23.8*   < > 20.3*  RBC 4.89   < > 4.37  HGB 13.9   < > 12.7  HCT 46.2*   < > 38.4  PLT 385   < > 319  MCV 94.5   < > 87.9  MCH 28.4   < > 29.1  MCHC 30.1   < > 33.1  RDW 15.6*   < > 15.5  LYMPHSABS 5.4*  --   --   MONOABS 0.9  --   --   EOSABS 0.2  --   --   BASOSABS 0.1  --   --    < > = values in this interval not displayed.    HEMOGLOBIN A1C Lab Results  Component Value Date   HGBA1C 5.4 11/06/2021   MPG 108.28 11/06/2021    Cardiac Panel (last 3 results) No results for input(s): CKTOTAL, CKMB, TROPONINI, RELINDX in the last 8760 hours.  BNP (last 3 results) No  results for input(s): BNP in the last 8760 hours.  TSH No results for input(s): TSH in the last 8760 hours.  Lipid Panel     Component Value Date/Time   CHOL 216 (H) 11/06/2021 2232   TRIG 61 11/06/2021 2232   HDL 32 (L) 11/06/2021 2232   CHOLHDL 6.8 11/06/2021 2232   VLDL 12 11/06/2021 2232   LDLCALC 172 (H) 11/06/2021 2232     Hepatic Function Panel Recent Labs    11/06/21 1851  PROT 6.5  ALBUMIN 3.4*  AST 62*  ALT 44  ALKPHOS 84  BILITOT 0.5    Imaging: CXR 11/06/2021: Mild increased interstitial markings is most consistent with mild edema given history. No other abnormalities.   Cardiac Studies:  EKG 11/07/2021: Sinus rhythm Evolution of anterolateral infarct  Echocardiogram: Pending  Coronary intervention 11/06/2021: LM: Normal LAD: Mid LAD occlusion (culprit)         Distal LAD tandem 80% stenoses (non-culprit) LCX: No significant disease RCA: Dominant vessel. Distal RPDA 70% disease   Hypertensive 200/100 mmHg with LVEDP 34 mmHg at the beginning of the case BP improved to 150/90 mmHg after relief from chest pain   Reperfusion rhythm noted after revascularization, without any ventricular arrhythmia   Successful percutaneous coronary  intervention mLAD        Aspiration throbectomy        PTCA and stent placement 2.75 X 34 mm Onyx drug-eluting stent        Post dilatation with 3.0X18 mm [place] balloon at 18 atm   EKG 11/06/2021: Sinus rhythm Acute anterolateral injury  Assessment & Recommendations:  41 y.o. Caucasian female, smoker, new diagnosis mixed hyperlipidemia, hyperglycemia, with out of hospital cardiac arrest w/VF, anterolateral STEMI   OHCA, anterolateral STEMI: Delay in bringing patient to the cath lab due ongoing STEMI in the lab and requiring standby STEMI team to be called in including the myself Culprit vessel mid LAD with primary PCI  Aspiration throbectomy        PTCA and stent placement 2.75 X 34 mm Onyx drug-eluting stent        Post dilatation with 3.0X18 mm [place] balloon at 18 atm Received Aggrastat bolus in cath lab Residual dLAD and dRPDA disease best treated medically, especially in light of elevated LVEDP and cardiac arrest Trop HS >24,000 Resolution of presentation chest pain. Still has ST elevation on EKG, likely evolution of relatively late presentation anterolateral infarct NSVT overnight, no sustained VT/VF DAPT with Aspirin/Brilinta Lipitor 80 mg Echocardiogram pending. Expect anterolateral hypokinesis and reduced LVEF Change metoprolol tartrate 12.5 mg bid so succinate 25 mg daily. Change losartan 25 mg daily to Entresto 24-26 mg bid. Start spironolactone 25 mg daily. Discontinue lasix 20 mg IV bid after morning dose. K replaced. Smoking cessation A1C, lipoprotein (a) pending Candidate for EMPACT-MI clinical trial. Patient is interested.  Given anterior MI, VF arrest, anticipated low LVEF, recommend Life Vest.  Mixed hyperlipidemia: New diagnosis. Chol 223, LDL 161 Lipitor 80 mg for now. May need Repatha   Hyperglycemia: Could be stress reaction. Insulin converge. A1C pending.  Hypertension: Contributed by post CPR chest wall tenderness. Tylenol for pain  If no sustained  VT/VF this morning, will transfer to telemetry after echocardiogram.  CRITICAL CARE Performed by: Vernell Leep   Total critical care time: 40 minutes   Critical care time was exclusive of separately billable procedures and treating other patients.   Critical care was necessary to treat or prevent imminent or life-threatening deterioration.   Critical  care was time spent personally by me on the following activities: development of treatment plan with patient and/or surrogate as well as nursing, discussions with consultants, evaluation of patient's response to treatment, examination of patient, obtaining history from patient or surrogate, ordering and performing treatments and interventions, ordering and review of laboratory studies, ordering and review of radiographic studies, pulse oximetry and re-evaluation of patient's condition.      Nigel Mormon, MD Pager: [phone] Office: [phone]

## 2021-11-08 ENCOUNTER — Telehealth: Payer: Self-pay

## 2021-11-08 ENCOUNTER — Encounter (HOSPITAL_COMMUNITY): Payer: Self-pay | Admitting: Cardiology

## 2021-11-08 ENCOUNTER — Inpatient Hospital Stay (HOSPITAL_COMMUNITY): Payer: Medicaid Other

## 2021-11-08 ENCOUNTER — Other Ambulatory Visit (HOSPITAL_COMMUNITY): Payer: Self-pay

## 2021-11-08 LAB — BASIC METABOLIC PANEL
Anion gap: 7 (ref 5–15)
BUN: 13 mg/dL (ref 6–20)
CO2: 27 mmol/L (ref 22–32)
Calcium: 8.8 mg/dL — ABNORMAL LOW (ref 8.9–10.3)
Chloride: 101 mmol/L (ref 98–111)
Creatinine, Ser: 0.9 mg/dL (ref 0.44–1.00)
GFR, Estimated: >60 mL/min (ref >60)
Glucose, Bld: 95 mg/dL (ref 70–99)
Potassium: 3.6 mmol/L (ref 3.5–5.1)
Sodium: 135 mmol/L (ref 135–145)

## 2021-11-08 LAB — CBC
HCT: 40.1 % (ref 36.0–46.0)
Hemoglobin: 12.7 g/dL (ref 12.0–15.0)
MCH: 28.3 pg (ref 26.0–34.0)
MCHC: 31.7 g/dL (ref 30.0–36.0)
MCV: 89.3 fL (ref 80.0–100.0)
Platelets: 306 10*3/uL (ref 150–400)
RBC: 4.49 MIL/uL (ref 3.87–5.11)
RDW: 15.9 % — ABNORMAL HIGH (ref 11.5–15.5)
WBC: 12.1 10*3/uL — ABNORMAL HIGH (ref 4.0–10.5)
nRBC: 0 % (ref 0.0–0.2)

## 2021-11-08 LAB — POCT ACTIVATED CLOTTING TIME: Activated Clotting Time: 293 seconds

## 2021-11-08 LAB — GLUCOSE, CAPILLARY
Glucose-Capillary: 269 mg/dL — ABNORMAL HIGH (ref 70–99)
Glucose-Capillary: 99 mg/dL (ref 70–99)
Glucose-Capillary: 130 mg/dL — ABNORMAL HIGH (ref 70–99)
Glucose-Capillary: 87 mg/dL (ref 70–99)
Glucose-Capillary: 116 mg/dL — ABNORMAL HIGH (ref 70–99)

## 2021-11-08 LAB — BRAIN NATRIURETIC PEPTIDE: B Natriuretic Peptide: 308.7 pg/mL — ABNORMAL HIGH (ref 0.0–100.0)

## 2021-11-08 MED ORDER — SPIRONOLACTONE 25 MG PO TABS
25.0000 mg | ORAL_TABLET | Freq: Every day | ORAL | 3 refills | Status: DC
  Filled 2021-11-08: qty 30, 30d supply, fill #0

## 2021-11-08 MED ORDER — METOPROLOL SUCCINATE ER 50 MG PO TB24
50.0000 mg | ORAL_TABLET | Freq: Every day | ORAL | 3 refills | Status: DC
  Filled 2021-11-08: qty 30, 30d supply, fill #0

## 2021-11-08 MED ORDER — DIPHENHYDRAMINE HCL 25 MG PO CAPS
50.0000 mg | ORAL_CAPSULE | Freq: Once | ORAL | Status: AC
  Administered 2021-11-08: 50 mg via ORAL
  Filled 2021-11-08: qty 2

## 2021-11-08 MED ORDER — LIDOCAINE HCL (PF) 1 % IJ SOLN
INTRAMUSCULAR | Status: AC
  Filled 2021-11-08: qty 30

## 2021-11-08 MED ORDER — HEPARIN (PORCINE) IN NACL 1000-0.9 UT/500ML-% IV SOLN
INTRAVENOUS | Status: AC
  Filled 2021-11-08: qty 1000

## 2021-11-08 MED ORDER — AMLODIPINE BESYLATE 5 MG PO TABS
5.0000 mg | ORAL_TABLET | Freq: Every day | ORAL | 3 refills | Status: DC
  Filled 2021-11-08: qty 30, 30d supply, fill #0

## 2021-11-08 MED ORDER — DIPHENHYDRAMINE HCL 25 MG PO CAPS
50.0000 mg | ORAL_CAPSULE | Freq: Four times a day (QID) | ORAL | Status: DC | PRN
  Administered 2021-11-08: 50 mg via ORAL
  Filled 2021-11-08: qty 2

## 2021-11-08 MED ORDER — ATORVASTATIN CALCIUM 80 MG PO TABS
80.0000 mg | ORAL_TABLET | Freq: Every day | ORAL | 3 refills | Status: DC
  Filled 2021-11-08: qty 30, 30d supply, fill #0

## 2021-11-08 MED ORDER — LOSARTAN POTASSIUM 50 MG PO TABS
50.0000 mg | ORAL_TABLET | Freq: Every day | ORAL | 3 refills | Status: DC
  Filled 2021-11-08: qty 30, 30d supply, fill #0

## 2021-11-08 MED ORDER — TICAGRELOR 90 MG PO TABS
90.0000 mg | ORAL_TABLET | Freq: Two times a day (BID) | ORAL | 3 refills | Status: DC
  Filled 2021-11-08: qty 60, 30d supply, fill #0

## 2021-11-08 MED ORDER — VERAPAMIL HCL 2.5 MG/ML IV SOLN
INTRAVENOUS | Status: AC
  Filled 2021-11-08: qty 2

## 2021-11-08 MED ORDER — ASPIRIN 81 MG PO TBEC
81.0000 mg | DELAYED_RELEASE_TABLET | Freq: Every day | ORAL | 3 refills | Status: DC
  Filled 2021-11-08: qty 30, 30d supply, fill #0

## 2021-11-08 MED ORDER — HEPARIN SODIUM (PORCINE) 1000 UNIT/ML IJ SOLN
INTRAMUSCULAR | Status: AC
  Filled 2021-11-08: qty 10

## 2021-11-08 MED ORDER — POTASSIUM CHLORIDE CRYS ER 20 MEQ PO TBCR
40.0000 meq | EXTENDED_RELEASE_TABLET | Freq: Once | ORAL | Status: AC
  Administered 2021-11-08: 40 meq via ORAL
  Filled 2021-11-08: qty 2

## 2021-11-08 MED FILL — Tirofiban HCl in NaCl 0.9% IV Soln 5 MG/100ML (Base Equiv): INTRAVENOUS | Qty: 100 | Status: AC

## 2021-11-08 MED FILL — Heparin Sod (Porcine)-NaCl IV Soln 1000 Unit/500ML-0.9%: INTRAVENOUS | Qty: 1000 | Status: AC

## 2021-11-08 NOTE — Telephone Encounter (Signed)
-----   Message from Elder Negus, MD sent at 11/07/2021 10:24 AM EST ----- Regarding: [facility] Discharge follow up: TOC: Needed Follow up appt: 1/16 11:45 AM w/me (Please schedule) Discharge diagnosis: STEMI Discharge date: 1/9  Thanks MJP

## 2021-11-08 NOTE — Progress Notes (Signed)
Subjective:  Post CPR chest wall pain improved Still has shortness of breath on minimal exertion  Awaiting LifeVest   Objective:  Vital Signs in the last 24 hours: Temp:  [97.6 F (36.4 C)-98.8 F (37.1 C)] 98.2 F (36.8 C) (01/09 0409) Pulse Rate:  [72-90] 82 (01/09 0409) Resp:  [14-32] 18 (01/09 0409) BP: (142-189)/(73-136) 143/93 (01/09 0409) SpO2:  [85 %-98 %] 93 % (01/09 0409)  Intake/Output from previous day: 01/08 0701 - 01/09 0700 In: 725.4 [P.O.:480; I.V.:245.4] Out: 3450 [Urine:3300; Emesis/NG output:150]  Physical Exam Vitals and nursing note reviewed.  Constitutional:      General: She is not in acute distress.    Appearance: She is well-developed.  HENT:     Head: Normocephalic and atraumatic.  Eyes:     Conjunctiva/sclera: Conjunctivae normal.     Pupils: Pupils are equal, round, and reactive to light.  Neck:     Vascular: No JVD.  Cardiovascular:     Rate and Rhythm: Normal rate and regular rhythm.     Pulses: Normal pulses and intact distal pulses.     Heart sounds: No murmur heard. Pulmonary:     Effort: Pulmonary effort is normal.     Breath sounds: Normal breath sounds. No wheezing or rales.  Abdominal:     General: Bowel sounds are normal.     Palpations: Abdomen is soft.     Tenderness: There is no rebound.  Musculoskeletal:        General: No tenderness. Normal range of motion.     Right lower leg: No edema.     Left lower leg: No edema.  Lymphadenopathy:     Cervical: No cervical adenopathy.  Skin:    General: Skin is warm and dry.  Neurological:     Mental Status: She is alert and oriented to person, place, and time.     Cranial Nerves: No cranial nerve deficit.     Lab Results: BMP Recent Labs    11/06/21 1851 11/06/21 2231 11/07/21 0556  NA 134*  --  133*  K 3.5  --  4.1  CL 102  --  103  CO2 12*  --  20*  GLUCOSE 340*  --  145*  BUN 11  --  11  CREATININE 1.16* 0.86 0.80  CALCIUM 8.5*  --  8.5*  GFRNONAA >60 >60 >60      CBC Recent Labs  Lab 11/06/21 1851 11/06/21 2231 11/07/21 0556  WBC 23.8*   < > 20.3*  RBC 4.89   < > 4.37  HGB 13.9   < > 12.7  HCT 46.2*   < > 38.4  PLT 385   < > 319  MCV 94.5   < > 87.9  MCH 28.4   < > 29.1  MCHC 30.1   < > 33.1  RDW 15.6*   < > 15.5  LYMPHSABS 5.4*  --   --   MONOABS 0.9  --   --   EOSABS 0.2  --   --   BASOSABS 0.1  --   --    < > = values in this interval not displayed.     HEMOGLOBIN A1C Lab Results  Component Value Date   HGBA1C 5.4 11/07/2021   MPG 108.28 11/07/2021    Cardiac Panel (last 3 results) No results for input(s): CKTOTAL, CKMB, TROPONINI, RELINDX in the last 8760 hours.  BNP (last 3 results) Recent Labs    11/07/21 0556  BNP 128.1*  TSH No results for input(s): TSH in the last 8760 hours.  Lipid Panel     Component Value Date/Time   CHOL 216 (H) 11/06/2021 2232   TRIG 61 11/06/2021 2232   HDL 32 (L) 11/06/2021 2232   CHOLHDL 6.8 11/06/2021 2232   VLDL 12 11/06/2021 2232   LDLCALC 172 (H) 11/06/2021 2232     Hepatic Function Panel Recent Labs    11/06/21 1851  PROT 6.5  ALBUMIN 3.4*  AST 62*  ALT 44  ALKPHOS 84  BILITOT 0.5     Imaging: CXR 11/06/2021: Mild increased interstitial markings is most consistent with mild edema given history. No other abnormalities.   Cardiac Studies:  EKG 11/07/2021: Sinus rhythm Evolution of anterolateral infarct  Echocardiogram: Pending  Coronary intervention 11/06/2021: LM: Normal LAD: Mid LAD occlusion (culprit)         Distal LAD tandem 80% stenoses (non-culprit) LCX: No significant disease RCA: Dominant vessel. Distal RPDA 70% disease   Hypertensive 200/100 mmHg with LVEDP 34 mmHg at the beginning of the case BP improved to 150/90 mmHg after relief from chest pain   Reperfusion rhythm noted after revascularization, without any ventricular arrhythmia   Successful percutaneous coronary intervention mLAD        Aspiration throbectomy        PTCA and  stent placement 2.75 X 34 mm Onyx drug-eluting stent        Post dilatation with 3.0X18 mm [place] balloon at 18 atm   EKG 11/06/2021: Sinus rhythm Acute anterolateral injury  Assessment & Recommendations:  41 y.o. Caucasian female, smoker, new diagnosis mixed hyperlipidemia, hyperglycemia, with out of hospital cardiac arrest w/VF, anterolateral STEMI   OHCA, anterolateral STEMI: Delay in bringing patient to the cath lab due ongoing STEMI in the lab and requiring standby STEMI team to be called in including the myself Culprit vessel mid LAD with primary PCI  Aspiration throbectomy        PTCA and stent placement 2.75 X 34 mm Onyx drug-eluting stent        Post dilatation with 3.0X18 mm [place] balloon at 18 atm Received Aggrastat bolus in cath lab Residual dLAD and dRPDA disease best treated medically, especially in light of elevated LVEDP and cardiac arrest Trop HS >24,000 Resolution of presentation chest pain. Still has ST elevation on EKG, likely evolution of relatively late presentation anterolateral infarct NSVT overnight, no sustained VT/VF DAPT with Aspirin/Brilinta Lipitor 80 mg EF 50-55% with mid to distal LAD territory hypokinesis Echocardiogram pending. Expect anterolateral hypokinesis and reduced LVEF Continue metoprolol succinate 50 mg daily, losartan 50 mg daily, spironolactone 25 mg daily. Will discontinue IV lasix after today. Smoking cessation Potential candidate for EMPACT-MI clinical trial. Patient is interested.  Given anterior MI, VF arrest, recommend Life Vest.  Mixed hyperlipidemia: New diagnosis. Chol 223, LDL 161 Lipitor 80 mg for now. May need Repatha   Hyperglycemia: Improved. A1C normal  Hypertension: Now better controlled on metoprolol succinate 50 mg daily, losartan 50 mg daily, spironolactone 25 mg daily, amlodipine 5 mg daily.  Anticipate discharge tomorrow.      Nigel Mormon, MD Pager: [phone] Office: [phone]

## 2021-11-08 NOTE — Progress Notes (Signed)
Pt arrived to unit from 2 heart, VSS, A/O x 4,  CCMD called ,CHG given, pt oriented to unit,Will continue to monitor.   Karna Christmas Shasta Chinn, RN    11/08/21 1300  Vitals  Temp 97.8 F (36.6 C)  Temp Source Oral  BP 134/88  MAP (mmHg) 102  BP Location Left Arm  BP Method Automatic  Patient Position (if appropriate) Lying  Pulse Rate 81  Pulse Rate Source Monitor  ECG Heart Rate 81  Resp 16  Level of Consciousness  Level of Consciousness Alert  Oxygen Therapy  SpO2 92 %  O2 Device Room Air  Pain Assessment  Pain Scale 0-10  Pain Score 5  MEWS Score  MEWS Temp 0  MEWS Systolic 0  MEWS Pulse 0  MEWS RR 0  MEWS LOC 0  MEWS Score 0  MEWS Score Color Green

## 2021-11-08 NOTE — Care Management (Signed)
1540 11-08-21 Updated Life Vest Orders submitted to [employer]. Case Manager will continue to follow.

## 2021-11-08 NOTE — Progress Notes (Signed)
CARDIAC REHAB PHASE I   Went to see pt. Pt c/o fatigue at this time. MI materials left at bedside. Will f/u as able.  Reynold Bowen, RN BSN 11/08/2021 2:18 PM

## 2021-11-09 ENCOUNTER — Other Ambulatory Visit (HOSPITAL_COMMUNITY): Payer: Self-pay

## 2021-11-09 ENCOUNTER — Encounter: Payer: Self-pay | Admitting: Cardiology

## 2021-11-09 LAB — LIPOPROTEIN A (LPA): Lipoprotein (a): 42.2 nmol/L — ABNORMAL HIGH (ref <75.0)

## 2021-11-09 LAB — GLUCOSE, CAPILLARY
Glucose-Capillary: 93 mg/dL (ref 70–99)
Glucose-Capillary: 101 mg/dL — ABNORMAL HIGH (ref 70–99)
Glucose-Capillary: 101 mg/dL — ABNORMAL HIGH (ref 70–99)

## 2021-11-09 LAB — POCT ACTIVATED CLOTTING TIME: Activated Clotting Time: 762 seconds

## 2021-11-09 NOTE — TOC Transition Note (Signed)
Transition of Care (TOC) - CM/SW Discharge Note Donn Pierini RN, BSN Transitions of Care Unit 4E- RN Case Manager See Treatment Team for direct phone #    Patient Details  Name: Nicole Orr MRN: [id number] Date of Birth: [birth year]  Transition of Care [facility]) CM/SW Contact:  Darrold Span, RN Phone Number: 11/09/2021, 2:40 PM   Clinical Narrative:    Pt stable for transition home today, f/u appointment for primary care needs has been made with [facility] for Feb 14. Pt will be assisted with Medications through [facility]- pt entered into [facility] program, and TOC pharmacy to fill and deliver meds to bedside prior to discharge. Will need assistance program for ongoing Brilinta needs- cards aware.   CM checked with [employer] this am regarding Life Vest- pt has been approved and they will plan to fit pt this afternoon at the bedside prior to discharge.  Update- 1410- per Alvino Chapel with [employer] - it will be around 5pm when they will be here to do the Life Vest fitting at the bedside- Bedside Rn updated on timeframe.    Final next level of care: Home/Self Care Barriers to Discharge: Other (must enter comment) (Awaiting Life Vest fitting-)   Patient Goals and CMS Choice    N/A    Discharge Placement               home        Discharge Plan and Services   Discharge Planning Services: CM Consult, [facility]ATCH Program, Medication Assistance, Follow-up appt scheduled, [facility] Post Acute Care Choice: NA          DME Arranged: Life vest DME Agency: [facility] Date DME Agency Contacted: 11/07/21   Representative spoke with at DME Agency: Alvino Chapel [facility] Arranged: NA          Social Determinants of Health (SDOH) Interventions     Readmission Risk Interventions Readmission Risk Prevention Plan 11/09/2021  Post Dischage Appt Complete  Medication Screening Complete  Transportation Screening Complete  Some recent data might be hidden

## 2021-11-09 NOTE — Progress Notes (Signed)
CARDIAC REHAB PHASE I   PRE:  Rate/Rhythm: 76 SR  BP:  Sitting: 163/105      SaO2: 95 RA  MODE:  Ambulation: 370 ft   POST:  Rate/Rhythm: 96 SR  BP:  Sitting: 172/89    SaO2: 96 RA  Pt ambulated 346ft in hallway independently with steady gait. Pt denies CP or dizziness, does endorse some mile SOB. Pt returned to bed. Stent/MI education completed with pt and mom. Pt educated on importance of ASA, Brilinta, statin, and NTG. Pt given MI book and heart healthy diet. Reviewed site care, restrictions, and exercise guidelines. Will refer to CRP [facility] [place].   [id number] Reynold Bowen, RN BSN 11/09/2021 9:26 AM

## 2021-11-09 NOTE — Telephone Encounter (Signed)
Pt still currently admitted.

## 2021-11-09 NOTE — Progress Notes (Signed)
Discharge instructions reviewed with patient. Patient verbalized understanding. VS stable. Currently waiting for [employer] life vest and will d/c home with family

## 2021-11-09 NOTE — Discharge Summary (Addendum)
Physician Discharge Summary  Patient ID: Nicole Orr MRN: [id number] DOB/AGE: [birth year] 41 y.o.  Admit date: 11/06/2021 Discharge date: 11/09/2021  Primary Discharge Diagnosis: STEMI Cardiac arrest Ventricular fibrillation  Secondary Discharge Diagnosis: Hypertension   Hospital Course:   41 y.o. Caucasian female  with hypertension, mixed hyperlipidemia, tobacco dependence, admitted with OHCA with Vfib 2/2 acute anterior STEMI, now s/p PPCI to LAD  HPI:  Patient reportedly had chest pain earlier in the day, took an aspirin and went to work.  She had a witnessed cardiac arrest with bystander CPR.  EMS arrived and found her to be in VF, shocked 4 times, initial rhythm v fib, defib x 4, epi, amiodarone 300 mg, 12 minutes of cpr with rosc. Patient alert and oriented post [place] achieved.  Patient currently is awake and alert, complains of severe chest pain.  EKG shows anterolateral ST elevation. Delay in bringing patient to the cath lab due ongoing STEMI in the lab and requiring standby STEMI tea to be called in including the myself.    Hospital course: Patient underwent primary PCI to prox LAD, rest of mid to distal LAD disease and dRPDA disease best treated medically. Patient required IV diuresis owing to elevated LVEDP. EF fairly well preserved in spite of WMA. Given her OHCA presentation, I recommended LifeVest for 43 month. On the day of discharge, patient ambulated without chest pain, mild dyspnea. She has residual chest wall pain from CPR< which should improve with yimr.   Did the patient have an acute coronary syndrome (MI, NSTEMI, STEMI, etc) this admission?: Yes                               AHA/ACC Clinical Performance & Quality Measures: Aspirin prescribed? - Yes ADP Receptor Inhibitor (Plavix/Clopidogrel, Brilinta/Ticagrelor or Effient/Prasugrel) prescribed (includes medically managed patients)? - Yes Beta Blocker prescribed? - Yes High Intensity Statin (Lipitor 40-63m or  Crestor 20-43m prescribed? - Yes EF assessed during THIS hospitalization? - Yes For EF <40%, was ACEI/ARB prescribed? - Not Applicable (EF >/= 4056%For EF <40%, Aldosterone Antagonist (Spironolactone or Eplerenone) prescribed? - Not Applicable (EF >/= 4097%Cardiac Rehab Phase II ordered (including medically managed patients)? - Yes   Discharge Exam: Blood pressure (!) 165/90, pulse 79, temperature 98 F (36.7 C), temperature source Oral, resp. rate 19, height 5' 4.49" (1.638 m), weight 90.7 kg, SpO2 99 %.   Physical Exam Vitals and nursing note reviewed.  Constitutional:      General: She is not in acute distress.    Appearance: She is well-developed.  HENT:     Head: Normocephalic and atraumatic.  Eyes:     Conjunctiva/sclera: Conjunctivae normal.     Pupils: Pupils are equal, round, and reactive to light.  Neck:     Vascular: No JVD.  Cardiovascular:     Rate and Rhythm: Normal rate and regular rhythm.     Pulses: Normal pulses and intact distal pulses.     Heart sounds: No murmur heard. Pulmonary:     Effort: Pulmonary effort is normal.     Breath sounds: Normal breath sounds. No wheezing or rales.  Abdominal:     General: Bowel sounds are normal.     Palpations: Abdomen is soft.     Tenderness: There is no rebound.  Musculoskeletal:        General: No tenderness. Normal range of motion.     Right lower leg: No edema.  Left lower leg: No edema.  Lymphadenopathy:     Cervical: No cervical adenopathy.  Skin:    General: Skin is warm and dry.  Neurological:     Mental Status: She is alert and oriented to person, place, and time.     Cranial Nerves: No cranial nerve deficit.   Significant Diagnostic Studies:  EKG 11/07/2021: Sinus rhythm Evolution of anterolateral infarct   Echocardiogram 11/07/221:  1. Left ventricular ejection fraction, by estimation, is 50 to 55%. The  left ventricle has low normal function. The left ventricle demonstrates  regional wall  motion abnormalities (see scoring diagram/findings for  description). There is mild left  ventricular hypertrophy. Left ventricular diastolic parameters are  consistent with Grade II diastolic dysfunction (pseudonormalization).  Elevated left atrial pressure. There is mild hypokinesis of the left  ventricular, mid-apical anteroseptal wall and  apical segment.   2. Right ventricular systolic function is normal. The right ventricular  size is normal.   3. The mitral valve is grossly normal. Mild mitral valve regurgitation.   4. The aortic valve is normal in structure. Aortic valve regurgitation is  not visualized. No aortic stenosis is present.   5. The inferior vena cava is dilated in size with <50% respiratory  variability, suggesting right atrial pressure of 15 mmHg.    Coronary intervention 11/06/2021: LM: Normal LAD: Mid LAD occlusion (culprit)         Distal LAD tandem 80% stenoses (non-culprit) LCX: No significant disease RCA: Dominant vessel. Distal RPDA 70% disease   Hypertensive 200/100 mmHg with LVEDP 34 mmHg at the beginning of the case BP improved to 150/90 mmHg after relief from chest pain   Reperfusion rhythm noted after revascularization, without any ventricular arrhythmia   Successful percutaneous coronary intervention mLAD        Aspiration throbectomy        PTCA and stent placement 2.75 X 34 mm Onyx drug-eluting stent        Post dilatation with 3.0X18 mm [place] balloon at 18 atm   EKG 11/06/2021: Sinus rhythm Acute anterolateral injury  Labs:   Lab Results  Component Value Date   WBC 12.1 (H) 11/08/2021   HGB 12.7 11/08/2021   HCT 40.1 11/08/2021   MCV 89.3 11/08/2021   PLT 306 11/08/2021    Recent Labs  Lab 11/06/21 1851 11/06/21 2231 11/08/21 0700  NA 134*   < > 135  K 3.5   < > 3.6  CL 102   < > 101  CO2 12*   < > 27  BUN 11   < > 13  CREATININE 1.16*   < > 0.90  CALCIUM 8.5*   < > 8.8*  PROT 6.5  --   --   BILITOT 0.5  --   --   ALKPHOS 84   --   --   ALT 44  --   --   AST 62*  --   --   GLUCOSE 340*   < > 95   < > = values in this interval not displayed.    Lipid Panel     Component Value Date/Time   CHOL 216 (H) 11/06/2021 2232   TRIG 61 11/06/2021 2232   HDL 32 (L) 11/06/2021 2232   CHOLHDL 6.8 11/06/2021 2232   VLDL 12 11/06/2021 2232   LDLCALC 172 (H) 11/06/2021 2232    BNP (last 3 results) Recent Labs    11/07/21 0556 11/08/21 0700  BNP 128.1* 308.7*  HEMOGLOBIN A1C Lab Results  Component Value Date   HGBA1C 5.4 11/07/2021   MPG 108.28 11/07/2021    Cardiac Panel (last 3 results) No results for input(s): CKTOTAL, CKMB, TROPONINI, RELINDX in the last 8760 hours.  No results found for: CKTOTAL, CKMB, CKMBINDEX, TROPONINI   TSH No results for input(s): TSH in the last 8760 hours.  Radiology: CARDIAC CATHETERIZATION  Result Date: 11/06/2021 Images from the original result were not included. LM: Normal LAD: Mid LAD occlusion (culprit)         Distal LAD tandem 80% stenoses (non-culprit) LCX: No significant disease RCA: Dominant vessel. Distal RPDA 70% disease Hypertensive 200/100 mmHg with LVEDP 34 mmHg at the beginning of the case BP improved to 150/90 mmHg after relief from chest pain Reperfusion rhythm noted after revascularization, without any ventricular arrhythmia Successful percutaneous coronary intervention mLAD        Aspiration throbectomy        PTCA and stent placement 2.75 X 34 mm Onyx drug-eluting stent        Post dilatation with 3.0X18 mm [place] balloon at 18 atm Nigel Mormon, MD Pager: [phone] Office: [phone]   DG CHEST PORT 1 VIEW  Result Date: 11/08/2021 CLINICAL DATA:  Status post catheterization. EXAM: PORTABLE CHEST 1 VIEW COMPARISON:  November 06, 2021. FINDINGS: Stable cardiomediastinal silhouette. Minimal bibasilar subsegmental atelectasis is noted. Bony thorax is unremarkable. IMPRESSION: Minimal bibasilar subsegmental atelectasis. Electronically Signed   By: Marijo Conception M.D.   On: 11/08/2021 08:08   DG Chest Port 1 View  Result Date: 11/06/2021 CLINICAL DATA:  Status post arrest.  CPR. EXAM: PORTABLE CHEST 1 VIEW COMPARISON:  July 22, 2007 FINDINGS: No pneumothorax. The cardiomediastinal silhouette is stable given portable technique. Mild increased interstitial markings. No focal infiltrate. No nodule or mass. No other acute abnormalities. IMPRESSION: Mild increased interstitial markings is most consistent with mild edema given history. No other abnormalities. Electronically Signed   By: Dorise Bullion III M.D.   On: 11/06/2021 19:24   ECHOCARDIOGRAM COMPLETE  Result Date: 11/07/2021    ECHOCARDIOGRAM REPORT   Patient Name:   Nicole Orr Date of Exam: 11/07/2021 Medical Rec #:  [id number]     Height:       64.5 in Accession #:    [id number]    Weight:       200.0 lb Date of Birth:  07-Jan-1981     BSA:          1.967 m Patient Age:    54 years      BP:           188/108 mmHg Patient Gender: F             HR:           85 bpm. Exam Location:  Inpatient Procedure: 2D Echo, 3D Echo, Cardiac Doppler, Color Doppler and Intracardiac            Opacification Agent Indications:     NSTEMI post cath  History:         Patient has no prior history of Echocardiogram examinations.                  CAD and Acute MI; Cardiac cath yesterday. Cardiac arrest.  Sonographer:     Merrie Roof RDCS Referring Phys:  [id number] [facility] J Patience Nuzzo Diagnosing Phys: Vernell Leep MD IMPRESSIONS  1. Left ventricular ejection fraction, by estimation, is 50 to 55%. The left ventricle has low  normal function. The left ventricle demonstrates regional wall motion abnormalities (see scoring diagram/findings for description). There is mild left ventricular hypertrophy. Left ventricular diastolic parameters are consistent with Grade II diastolic dysfunction (pseudonormalization). Elevated left atrial pressure. There is mild hypokinesis of the left ventricular, mid-apical anteroseptal wall and  apical segment.  2. Right ventricular systolic function is normal. The right ventricular size is normal.  3. The mitral valve is grossly normal. Mild mitral valve regurgitation.  4. The aortic valve is normal in structure. Aortic valve regurgitation is not visualized. No aortic stenosis is present.  5. The inferior vena cava is dilated in size with <50% respiratory variability, suggesting right atrial pressure of 15 mmHg. FINDINGS  Left Ventricle: Left ventricular ejection fraction, by estimation, is 50 to 55%. The left ventricle has low normal function. The left ventricle demonstrates regional wall motion abnormalities. Mild hypokinesis of the left ventricular, mid-apical anteroseptal wall and apical segment. The left ventricular internal cavity size was normal in size. There is mild left ventricular hypertrophy. Left ventricular diastolic parameters are consistent with Grade II diastolic dysfunction (pseudonormalization). Elevated left atrial pressure. Right Ventricle: The right ventricular size is normal. No increase in right ventricular wall thickness. Right ventricular systolic function is normal. Left Atrium: Left atrial size was normal in size. Right Atrium: Right atrial size was normal in size. Pericardium: There is no evidence of pericardial effusion. Mitral Valve: The mitral valve is grossly normal. Mild mitral valve regurgitation. Tricuspid Valve: The tricuspid valve is normal in structure. Tricuspid valve regurgitation is not demonstrated. No evidence of tricuspid stenosis. Aortic Valve: The aortic valve is normal in structure. Aortic valve regurgitation is not visualized. No aortic stenosis is present. Aortic valve mean gradient measures 3.0 mmHg. Aortic valve peak gradient measures 6.2 mmHg. Aortic valve area, by VTI measures 2.15 cm. Pulmonic Valve: The pulmonic valve was normal in structure. Pulmonic valve regurgitation is not visualized. No evidence of pulmonic stenosis. Aorta: The aortic root is  normal in size and structure. Venous: The inferior vena cava is dilated in size with less than 50% respiratory variability, suggesting right atrial pressure of 15 mmHg. IAS/Shunts: The interatrial septum was not assessed.  LEFT VENTRICLE PLAX 2D LVIDd:         4.10 cm      Diastology LVIDs:         2.70 cm      LV e' medial:    6.98 cm/s LV PW:         1.20 cm      LV E/e' medial:  14.6 LV IVS:        1.20 cm      LV e' lateral:   6.68 cm/s LVOT diam:     1.80 cm      LV E/e' lateral: 15.3 LV SV:         46 LV SV Index:   24 LVOT Area:     2.54 cm                              3D Volume EF: LV Volumes (MOD)            3D EF:        58 % LV vol d, MOD A2C: 139.0 ml LV EDV:       142 ml LV vol d, MOD A4C: 171.0 ml LV ESV:       59 ml LV vol s,  MOD A2C: 78.0 ml  LV SV:        83 ml LV vol s, MOD A4C: 71.2 ml LV SV MOD A2C:     61.0 ml LV SV MOD A4C:     171.0 ml LV SV MOD BP:      85.6 ml RIGHT VENTRICLE          IVC RV Basal diam:  3.10 cm  IVC diam: 2.50 cm LEFT ATRIUM           Index        RIGHT ATRIUM           Index LA diam:      3.90 cm 1.98 cm/m   RA Area:     15.90 cm LA Vol (A2C): 53.9 ml 27.40 ml/m  RA Volume:   39.60 ml  20.13 ml/m  AORTIC VALVE AV Area (Vmax):    2.13 cm AV Area (Vmean):   2.01 cm AV Area (VTI):     2.15 cm AV Vmax:           124.00 cm/s AV Vmean:          81.000 cm/s AV VTI:            0.215 m AV Peak Grad:      6.2 mmHg AV Mean Grad:      3.0 mmHg LVOT Vmax:         104.00 cm/s LVOT Vmean:        64.000 cm/s LVOT VTI:          0.182 m LVOT/AV VTI ratio: 0.85  AORTA Ao Root diam: 3.10 cm MITRAL VALVE MV Area (PHT): 4.80 cm     SHUNTS MV Decel Time: 158 msec     Systemic VTI:  0.18 m MV E velocity: 102.00 cm/s  Systemic Diam: 1.80 cm MV A velocity: 93.40 cm/s MV E/A ratio:  1.09 Ninetta Adelstein MD Electronically signed by Vernell Leep MD Signature Date/Time: 11/07/2021/5:05:50 PM    Final       FOLLOW UP PLANS AND APPOINTMENTS Discharge Instructions     Amb Referral to  Cardiac Rehabilitation   Complete by: As directed    Diagnosis:  Coronary Stents STEMI     After initial evaluation and assessments completed: Virtual Based Care may be provided alone or in conjunction with Phase 2 Cardiac Rehab based on patient barriers.: Yes   Diet - low sodium heart healthy   Complete by: As directed    Increase activity slowly   Complete by: As directed       Allergies as of 11/09/2021       Reactions   Ceclor [cefaclor] Anaphylaxis   Penicillins Anaphylaxis   Shellfish Allergy Anaphylaxis   Allergic to all seafood   Chlorhexidine Itching   States it makes her itch d/t her eczema        Medication List     TAKE these medications    amLODipine 5 MG tablet Commonly known as: NORVASC Take 1 tablet (5 mg total) by mouth daily.   Aspirin Low Dose 81 MG EC tablet Generic drug: aspirin Take 1 tablet (81 mg total) by mouth daily. Swallow whole.   atorvastatin 80 MG tablet Commonly known as: LIPITOR Take 1 tablet (80 mg total) by mouth daily.   Brilinta 90 MG Tabs tablet Generic drug: ticagrelor Take 1 tablet (90 mg total) by mouth 2 (two) times daily.   diphenhydrAMINE 25 MG tablet Commonly known as:  BENADRYL Take 50 mg by mouth at bedtime.   losartan 50 MG tablet Commonly known as: COZAAR Take 1 tablet (50 mg total) by mouth daily.   metoprolol succinate 50 MG 24 hr tablet Commonly known as: TOPROL-XL Take 1 tablet (50 mg total) by mouth daily. Take with or immediately following a meal.   spironolactone 25 MG tablet Commonly known as: ALDACTONE Take 1 tablet (25 mg total) by mouth daily.   VITAMIN D3 PO Take 1 capsule by mouth daily.               Durable Medical Equipment  (From admission, onward)           Start     Ordered   11/08/21 1539  For home use only DME Vest life vest  Once       Comments: 1 month   11/08/21 1538            Follow-up Information     Kelce Bouton, Reynold Bowen, MD Follow up on 11/15/2021.    Specialties: Cardiology, Radiology Why: 11:45 AM Contact information: [place] [address] [place], [place], NP Follow up.   Specialty: Nurse Practitioner Why: TIME:  10:00 AM DATE:  Meyer Russel DOCTOR Annie Main , AMY Contact information: [place] [address] [phone]                   Nigel Mormon, MD Pager: [phone] Office: [phone] '

## 2021-11-10 ENCOUNTER — Other Ambulatory Visit: Payer: Self-pay

## 2021-11-10 MED ORDER — TRAMADOL HCL 50 MG PO TABS: 50.0000 mg | ORAL_TABLET | Freq: Four times a day (QID) | ORAL | 2 refills | Status: DC | PRN

## 2021-11-10 NOTE — Telephone Encounter (Signed)
error 

## 2021-11-10 NOTE — Telephone Encounter (Signed)
Called patient, Na, LMAM to call back.

## 2021-11-15 ENCOUNTER — Ambulatory Visit: Payer: Medicaid Other | Admitting: Cardiology

## 2021-11-15 ENCOUNTER — Encounter: Payer: Self-pay | Admitting: Cardiology

## 2021-11-15 ENCOUNTER — Other Ambulatory Visit: Payer: Self-pay

## 2021-11-15 VITALS — BP 158/94 | HR 64 | Temp 98.2°F | Resp 16 | Ht 64.0 in | Wt 193.0 lb

## 2021-11-15 DIAGNOSIS — F17210 Nicotine dependence, cigarettes, uncomplicated: Secondary | ICD-10-CM

## 2021-11-15 DIAGNOSIS — I252 Old myocardial infarction: Secondary | ICD-10-CM

## 2021-11-15 DIAGNOSIS — I25118 Atherosclerotic heart disease of native coronary artery with other forms of angina pectoris: Secondary | ICD-10-CM | POA: Insufficient documentation

## 2021-11-15 DIAGNOSIS — I2510 Atherosclerotic heart disease of native coronary artery without angina pectoris: Secondary | ICD-10-CM | POA: Insufficient documentation

## 2021-11-15 DIAGNOSIS — E782 Mixed hyperlipidemia: Secondary | ICD-10-CM

## 2021-11-15 DIAGNOSIS — I10 Essential (primary) hypertension: Secondary | ICD-10-CM

## 2021-11-15 DIAGNOSIS — Z8674 Personal history of sudden cardiac arrest: Secondary | ICD-10-CM

## 2021-11-15 MED ORDER — TICAGRELOR 90 MG PO TABS: 90.0000 mg | ORAL_TABLET | Freq: Two times a day (BID) | ORAL | 3 refills | Status: DC

## 2021-11-15 MED ORDER — NICOTINE POLACRILEX 2 MG MT GUM: 2.0000 mg | CHEWING_GUM | OROMUCOSAL | 3 refills | Status: DC | PRN

## 2021-11-15 MED ORDER — LOSARTAN POTASSIUM 50 MG PO TABS: 50.0000 mg | ORAL_TABLET | Freq: Every day | ORAL | 3 refills | Status: DC

## 2021-11-15 MED ORDER — FUROSEMIDE 20 MG PO TABS: 20.0000 mg | ORAL_TABLET | ORAL | 3 refills | Status: DC | PRN

## 2021-11-15 MED ORDER — ATORVASTATIN CALCIUM 80 MG PO TABS: 80.0000 mg | ORAL_TABLET | Freq: Every day | ORAL | 3 refills | Status: DC

## 2021-11-15 MED ORDER — AMLODIPINE BESYLATE 10 MG PO TABS: 10.0000 mg | ORAL_TABLET | Freq: Every day | ORAL | 3 refills | Status: DC

## 2021-11-15 MED ORDER — CHANTIX STARTING MONTH PAK 0.5 MG X 11 & 1 MG X 42 PO TBPK: 0.5000 mg | ORAL_TABLET | ORAL | 3 refills | Status: DC

## 2021-11-15 MED ORDER — METOPROLOL SUCCINATE ER 50 MG PO TB24: 50.0000 mg | ORAL_TABLET | Freq: Every day | ORAL | 3 refills | Status: DC

## 2021-11-15 MED ORDER — ASPIRIN 81 MG PO TBEC: 81.0000 mg | DELAYED_RELEASE_TABLET | Freq: Every day | ORAL | 3 refills | Status: DC

## 2021-11-15 MED ORDER — SPIRONOLACTONE 25 MG PO TABS: 25.0000 mg | ORAL_TABLET | Freq: Every day | ORAL | 3 refills | Status: DC

## 2021-11-15 NOTE — Telephone Encounter (Signed)
Pt in the office today 11/15/2021.

## 2021-11-15 NOTE — Progress Notes (Signed)
Subjective:   Nicole Orr, female    DOB: 1981-08-25, 41 y.o.   MRN: [id number]   Chief Complaint  Patient presents with   Coronary Artery Disease   Hospitalization Follow-up     HPI  41 y.o. Caucasian female  with hypertension, mixed hyperlipidemia, tobacco dependence, admitted with OHCA with Vfib 2/2 acute anterior STEMI, now s/p PPCI to LAD   HPI:  Patient reportedly had chest pain earlier in the day, took an aspirin and went to work.  She had a witnessed cardiac arrest with bystander CPR.  EMS arrived and found her to be in VF, shocked 4 times, initial rhythm v fib, defib x 4, epi, amiodarone 300 mg, 12 minutes of cpr with rosc. Patient alert and oriented post [place] achieved.  Patient currently is awake and alert, complains of severe chest pain.  EKG shows anterolateral ST elevation. Delay in bringing patient to the cath lab due ongoing STEMI in the lab and requiring standby STEMI tea to be called in including the myself.    Hospital course: Patient underwent primary PCI to prox LAD, rest of mid to distal LAD disease and dRPDA disease best treated medically. Patient required IV diuresis owing to elevated LVEDP. EF fairly well preserved in spite of WMA. Given her OHCA presentation, I recommended LifeVest for 1 month. On the day of discharge, patient ambulated without chest pain, mild dyspnea. She has residual chest wall pain from CPR< which should improve with yimr.   Patient is here for follow up with her mother. She has had no exertional chest pain since hospital discharge. However, she still has chest wall pain with deep breathing, laughing, coughing etc. She is compliant with her medical therapy. Unfortunately, she has gone back to smoking 1/2 PPD, attributes this to stress. Prior to MI, she was smoking 1 1/5 PPD. Blood pressure is elevated today. She has mild exertional dyspnea, denies orthopnea, PND, leg edema.   She works as a Child psychotherapist at [employer]. As part of her work, she  has to unload 15-20 lbs boxes. She asks if she can do this work.  She has limited insurance through her employer, that only covers for pregnancy related care.   Current Outpatient Medications on File Prior to Visit  Medication Sig Dispense Refill   amLODipine (NORVASC) 5 MG tablet Take 1 tablet (5 mg total) by mouth daily. 30 tablet 3   aspirin 81 MG EC tablet Take 1 tablet (81 mg total) by mouth daily. Swallow whole. 30 tablet 3   atorvastatin (LIPITOR) 80 MG tablet Take 1 tablet (80 mg total) by mouth daily. 30 tablet 3   Cholecalciferol (VITAMIN D3 PO) Take 1 capsule by mouth daily.     diphenhydrAMINE (BENADRYL) 25 MG tablet Take 50 mg by mouth at bedtime.     losartan (COZAAR) 50 MG tablet Take 1 tablet (50 mg total) by mouth daily. 30 tablet 3   metoprolol succinate (TOPROL-XL) 50 MG 24 hr tablet Take 1 tablet (50 mg total) by mouth daily. Take with or immediately following a meal. 30 tablet 3   Pseudoephedrine-DM-GG (ROBITUSSIN CF PO) Take by mouth.     spironolactone (ALDACTONE) 25 MG tablet Take 1 tablet (25 mg total) by mouth daily. 30 tablet 3   ticagrelor (BRILINTA) 90 MG TABS tablet Take 1 tablet (90 mg total) by mouth 2 (two) times daily. 60 tablet 3   traMADol (ULTRAM) 50 MG tablet Take 1 tablet (50 mg total) by mouth every 6 (  six) hours as needed. 30 tablet 2   No current facility-administered medications on file prior to visit.    Cardiovascular and other pertinent studies:  EKG 11/15/2021: Sinus rhythm 80 bpm Anterolateral infarct, age indeterminate   Echocardiogram 11/07/221:  1. Left ventricular ejection fraction, by estimation, is 50 to 55%. The  left ventricle has low normal function. The left ventricle demonstrates  regional wall motion abnormalities (see scoring diagram/findings for  description). There is mild left  ventricular hypertrophy. Left ventricular diastolic parameters are  consistent with Grade II diastolic dysfunction (pseudonormalization).   Elevated left atrial pressure. There is mild hypokinesis of the left  ventricular, mid-apical anteroseptal wall and  apical segment.   2. Right ventricular systolic function is normal. The right ventricular  size is normal.   3. The mitral valve is grossly normal. Mild mitral valve regurgitation.   4. The aortic valve is normal in structure. Aortic valve regurgitation is  not visualized. No aortic stenosis is present.   5. The inferior vena cava is dilated in size with <50% respiratory  variability, suggesting right atrial pressure of 15 mmHg.    Coronary intervention 11/06/2021: LM: Normal LAD: Mid LAD occlusion (culprit)         Distal LAD tandem 80% stenoses (non-culprit) LCX: No significant disease RCA: Dominant vessel. Distal RPDA 70% disease   Hypertensive 200/100 mmHg with LVEDP 34 mmHg at the beginning of the case BP improved to 150/90 mmHg after relief from chest pain   Reperfusion rhythm noted after revascularization, without any ventricular arrhythmia   Successful percutaneous coronary intervention mLAD        Aspiration throbectomy        PTCA and stent placement 2.75 X 34 mm Onyx drug-eluting stent        Post dilatation with 3.0X18 mm [place] balloon at 18 atm   EKG 11/06/2021: Sinus rhythm Acute anterolateral injury  Recent labs: 11/08/2021: Glucose 95, BUN/Cr 13/0.9. EGFR >60. Na/K 135/3.6. Rest of the CMP normal H/H 12/40. MCV 89. Platelets 306 HbA1C 5.4% Chol 216, TG 61, HDL 32, LDL 172 BNP 308 high  Review of Systems  Cardiovascular:  Positive for chest pain (Chest wall pain with coughing, laughing etc). Negative for dyspnea on exertion, leg swelling, palpitations and syncope.        Vitals:   11/15/21 1145 11/15/21 1149  BP: (!) 167/95 (!) 158/94  Pulse: 85 64  Resp: 16   Temp: 98.2 F (36.8 C)   SpO2: 98%      Body mass index is 33.13 kg/m. Filed Weights   11/15/21 1145  Weight: 193 lb (87.5 kg)     Objective:   Physical Exam Vitals and  nursing note reviewed.  Constitutional:      General: She is not in acute distress. Neck:     Vascular: No JVD.  Cardiovascular:     Rate and Rhythm: Normal rate and regular rhythm.     Pulses: Normal pulses.     Heart sounds: Normal heart sounds. No murmur heard. Pulmonary:     Effort: Pulmonary effort is normal.     Breath sounds: Normal breath sounds. No wheezing or rales.  Musculoskeletal:     Right lower leg: No edema.     Left lower leg: No edema.       Assessment & Recommendations:   41 y.o. Caucasian female, smoker, new diagnosis mixed hyperlipidemia, hyperglycemia, with out of hospital cardiac arrest w/VF, anterolateral STEMI   CAD: OHCA VF arrest  due to anterolateral STEMI 10/2021 Culprit vessel mid LAD with primary PCI  Aspiration throbectomy        PTCA and stent placement 2.75 X 34 mm Onyx drug-eluting stent        Post dilatation with 3.0X18 mm [place] balloon at 18 atm Residual dLAD and dRPDA disease best treated medically DAPT with Aspirin/Brilinta till 10/2022 Continue metoprolol succinate 50 mg daily, losartan 50 mg daily, spironolactone 25 mg daily.  Continue Lipitor 80 mg daily. Chek clipid panel in Feb. If LDL >70, will add Zetia.  Smoking cessation. Prescribed Chantix. Given anterior MI, VF arrest, recommend Life Vest for 1 month, currently wearing   Chest wall pain: Likely musculoskeletal 2/2 CPR. She does not want to take any pain medications due to intolerance. She is currently taking tlyneol 500 mgX2 4 times/day. I asked her to reduce this to total <200 mh/day.  Hypertension:: Chest wall pain likely contributing. Nonetheless, increased amlodipine to 10 mg daily. If SBP remains >140 mmhg, could increase losartan to 100 mg daily.   Mixed hyperlipidemia: New diagnosis. Chol 223, LDL 161 As above  I encouraged the patient to update me regarding home BP readings in 2 weeks  F/u in 3 months    Nigel Mormon, MD Pager: [phone] Office:  [phone]

## 2021-11-18 ENCOUNTER — Other Ambulatory Visit (HOSPITAL_BASED_OUTPATIENT_CLINIC_OR_DEPARTMENT_OTHER): Payer: Self-pay

## 2021-11-19 ENCOUNTER — Telehealth (HOSPITAL_COMMUNITY): Payer: Self-pay | Admitting: *Deleted

## 2021-11-19 NOTE — Telephone Encounter (Signed)
Patwardhan, Reynold Bowen, MD  Rowe Pavy, RN Okay to proceed, but apparently she has limited insurance. You may want to check with her.   Thanks  MJP        Previous Messages   ----- Message -----  From: Rowe Pavy, RN  Sent: 11/17/2021  10:53 AM EST  To: Nigel Mormon, MD   Dr. Virgina Jock,   Pt referred to CR by inpatient cardiac rehab phase I staff.  Seen in follow up by you on 1/16.  No mention of CR.  Your preference to wait on CR until life vest off in a month (you see him on 4/19 for next follow up) or okay to proceed with CR - note that we are about 4-6 weeks out for scheduling.   Thanks for your input   Maurice Small RN, BSN  Cardiac and Pulmonary Rehab Nurse Navigator

## 2021-11-23 ENCOUNTER — Other Ambulatory Visit: Payer: Self-pay

## 2021-11-23 DIAGNOSIS — I2510 Atherosclerotic heart disease of native coronary artery without angina pectoris: Secondary | ICD-10-CM

## 2021-11-23 MED ORDER — TICAGRELOR 90 MG PO TABS: 90.0000 mg | ORAL_TABLET | Freq: Two times a day (BID) | ORAL | 3 refills | Status: DC

## 2021-12-03 ENCOUNTER — Telehealth (HOSPITAL_COMMUNITY): Payer: Self-pay

## 2021-12-03 ENCOUNTER — Encounter (HOSPITAL_COMMUNITY): Payer: Self-pay

## 2021-12-03 NOTE — Telephone Encounter (Signed)
Attempted to call patient in regards to Cardiac Rehab - LM on VM Mailed letter 

## 2021-12-07 ENCOUNTER — Other Ambulatory Visit: Payer: Self-pay

## 2021-12-07 DIAGNOSIS — I2510 Atherosclerotic heart disease of native coronary artery without angina pectoris: Secondary | ICD-10-CM

## 2021-12-10 ENCOUNTER — Encounter: Payer: Self-pay | Admitting: Cardiology

## 2021-12-10 ENCOUNTER — Telehealth: Payer: Self-pay | Admitting: Cardiology

## 2021-12-10 NOTE — Telephone Encounter (Signed)
Patient has been out on FMLA and is calling in requesting a clearance letter for her to return to work on Monday. She is wanting to come pick up the letter Monday morning if cleared.

## 2021-12-10 NOTE — Telephone Encounter (Signed)
Called and left message on VM, letting patient know, letter she requested in on MyChart.

## 2021-12-10 NOTE — Telephone Encounter (Signed)
Placed on patients chart. She may be able view it on My Chart.  Thanks MJP

## 2021-12-10 NOTE — Progress Notes (Signed)
Erroneous encounter

## 2021-12-10 NOTE — Telephone Encounter (Signed)
Patient has been out on FMLA and is calling in requesting a clearance letter for her to return to work on Monday. She is wanting to come pick up the letter Monday morning if cleared.

## 2021-12-14 ENCOUNTER — Encounter: Payer: Medicaid Other | Admitting: Family

## 2021-12-14 DIAGNOSIS — Z09 Encounter for follow-up examination after completed treatment for conditions other than malignant neoplasm: Secondary | ICD-10-CM

## 2021-12-14 DIAGNOSIS — I252 Old myocardial infarction: Secondary | ICD-10-CM

## 2021-12-14 DIAGNOSIS — I2510 Atherosclerotic heart disease of native coronary artery without angina pectoris: Secondary | ICD-10-CM

## 2021-12-14 DIAGNOSIS — Z7689 Persons encountering health services in other specified circumstances: Secondary | ICD-10-CM

## 2021-12-14 DIAGNOSIS — I10 Essential (primary) hypertension: Secondary | ICD-10-CM

## 2021-12-14 DIAGNOSIS — Z8674 Personal history of sudden cardiac arrest: Secondary | ICD-10-CM

## 2021-12-14 DIAGNOSIS — F17210 Nicotine dependence, cigarettes, uncomplicated: Secondary | ICD-10-CM

## 2021-12-14 DIAGNOSIS — E782 Mixed hyperlipidemia: Secondary | ICD-10-CM

## 2021-12-23 ENCOUNTER — Telehealth (HOSPITAL_COMMUNITY): Payer: Self-pay

## 2021-12-23 NOTE — Telephone Encounter (Signed)
No response from pt in regards to Cardiac Rehab. Closed referral               

## 2022-02-16 ENCOUNTER — Ambulatory Visit: Payer: Medicaid Other | Admitting: Cardiology

## 2022-02-16 ENCOUNTER — Encounter: Payer: Self-pay | Admitting: Cardiology

## 2022-02-16 VITALS — BP 140/79 | HR 84 | Temp 97.6°F | Resp 16 | Ht 64.0 in | Wt 194.0 lb

## 2022-02-16 DIAGNOSIS — I2510 Atherosclerotic heart disease of native coronary artery without angina pectoris: Secondary | ICD-10-CM

## 2022-02-16 DIAGNOSIS — E782 Mixed hyperlipidemia: Secondary | ICD-10-CM

## 2022-02-16 DIAGNOSIS — I10 Essential (primary) hypertension: Secondary | ICD-10-CM

## 2022-02-16 DIAGNOSIS — F17210 Nicotine dependence, cigarettes, uncomplicated: Secondary | ICD-10-CM | POA: Insufficient documentation

## 2022-02-16 MED ORDER — ATORVASTATIN CALCIUM 80 MG PO TABS: 80.0000 mg | ORAL_TABLET | Freq: Every day | ORAL | 3 refills | Status: DC

## 2022-02-16 MED ORDER — LOSARTAN POTASSIUM 50 MG PO TABS: 50.0000 mg | ORAL_TABLET | Freq: Every day | ORAL | 3 refills | Status: DC

## 2022-02-16 MED ORDER — FUROSEMIDE 20 MG PO TABS
20.0000 mg | ORAL_TABLET | ORAL | 3 refills | Status: DC | PRN
Start: 2022-02-16 — End: 2022-08-03

## 2022-02-16 MED ORDER — ASPIRIN 81 MG PO TBEC: 81.0000 mg | DELAYED_RELEASE_TABLET | Freq: Every day | ORAL | 3 refills | Status: DC

## 2022-02-16 MED ORDER — NICOTINE POLACRILEX 2 MG MT GUM: 2.0000 mg | CHEWING_GUM | OROMUCOSAL | 2 refills | Status: DC | PRN

## 2022-02-16 MED ORDER — TICAGRELOR 90 MG PO TABS: 90.0000 mg | ORAL_TABLET | Freq: Two times a day (BID) | ORAL | 3 refills | Status: DC

## 2022-02-16 MED ORDER — AMLODIPINE BESYLATE 10 MG PO TABS: 10.0000 mg | ORAL_TABLET | Freq: Every day | ORAL | 3 refills | Status: DC

## 2022-02-16 NOTE — Progress Notes (Signed)
? ? ?Subjective:  ? ?Nicole Orr, female    DOB: August 20, 1981, 41 y.o.   MRN: [id number] ? ? ?Chief Complaint  ?Patient presents with  ? Coronary Artery Disease  ? Follow-up  ? ? ? ?HPI ? ?41 y.o. Caucasian female  with hypertension, mixed hyperlipidemia, tobacco dependence, admitted with [facility] with Vfib 2/2 acute anterior STEMI, now s/p PPCI to LAD ? ?Patient is doing well.  She has not had any recurrent chest pain.  She is trying to walk more.  She is down to 3 cigarettes/day and hoping to quit completely soon.  Blood pressure is well controlled at home, mildly elevated today in the office.  She takes Lasix every day, with which negative is well controlled.  When she ran out of Lasix, she had palpitation symptoms, which are now completely resolved.  She is now off LifeVest. ?  ? ? ?Current Outpatient Medications on File Prior to Visit  ?Medication Sig Dispense Refill  ? amLODipine (NORVASC) 10 MG tablet Take 1 tablet (10 mg total) by mouth daily. 90 tablet 3  ? aspirin 81 MG EC tablet Take 1 tablet (81 mg total) by mouth daily. Swallow whole. 90 tablet 3  ? atorvastatin (LIPITOR) 80 MG tablet Take 1 tablet (80 mg total) by mouth daily. 90 tablet 3  ? Cholecalciferol (VITAMIN D3 PO) Take 1 capsule by mouth daily.    ? diphenhydrAMINE (BENADRYL) 25 MG tablet Take 50 mg by mouth at bedtime.    ? furosemide (LASIX) 20 MG tablet Take 1 tablet (20 mg total) by mouth as needed. 60 tablet 3  ? losartan (COZAAR) 50 MG tablet Take 1 tablet (50 mg total) by mouth daily. 90 tablet 3  ? metoprolol succinate (TOPROL-XL) 50 MG 24 hr tablet Take 1 tablet (50 mg total) by mouth daily. Take with or immediately following a meal. 90 tablet 3  ? spironolactone (ALDACTONE) 25 MG tablet Take 1 tablet (25 mg total) by mouth daily. 90 tablet 3  ? ticagrelor (BRILINTA) 90 MG TABS tablet Take 1 tablet (90 mg total) by mouth 2 (two) times daily. 180 tablet 3  ? traMADol (ULTRAM) 50 MG tablet Take 1 tablet (50 mg total) by mouth every 6 (six)  hours as needed. 30 tablet 2  ? ?No current facility-administered medications on file prior to visit.  ? ? ?Cardiovascular and other pertinent studies: ? ?EKG 11/15/2021: ?Sinus rhythm 80 bpm ?Anterolateral infarct, age indeterminate ?  ?Echocardiogram 11/07/221: ? 1. Left ventricular ejection fraction, by estimation, is 50 to 55%. The  ?left ventricle has low normal function. The left ventricle demonstrates  ?regional wall motion abnormalities (see scoring diagram/findings for  ?description). There is mild left  ?ventricular hypertrophy. Left ventricular diastolic parameters are  ?consistent with Grade II diastolic dysfunction (pseudonormalization).  ?Elevated left atrial pressure. There is mild hypokinesis of the left  ?ventricular, mid-apical anteroseptal wall and  ?apical segment.  ? 2. Right ventricular systolic function is normal. The right ventricular  ?size is normal.  ? 3. The mitral valve is grossly normal. Mild mitral valve regurgitation.  ? 4. The aortic valve is normal in structure. Aortic valve regurgitation is  ?not visualized. No aortic stenosis is present.  ? 5. The inferior vena cava is dilated in size with <50% respiratory  ?variability, suggesting right atrial pressure of 15 mmHg.  ?  ?Coronary intervention 11/06/2021: ?LM: Normal ?LAD: Mid LAD occlusion (culprit) ?        Distal LAD tandem 80% stenoses (non-culprit) ?  LCX: No significant disease ?RCA: Dominant vessel. Distal RPDA 70% disease ?  ?Hypertensive 200/100 mmHg with LVEDP 34 mmHg at the beginning of the case ?BP improved to 150/90 mmHg after relief from chest pain ?  ?Reperfusion rhythm noted after revascularization, without any ventricular arrhythmia ?  ?Successful percutaneous coronary intervention mLAD ?       Aspiration throbectomy ?       PTCA and stent placement 2.75 X 34 mm Onyx drug-eluting stent ?       Post dilatation with 3.0X18 mm [place] balloon at 18 atm ?  ?EKG 11/06/2021: ?Sinus rhythm ?Acute anterolateral injury ? ?Recent  labs: ?11/08/2021: ?Glucose 95, BUN/Cr 13/0.9. EGFR >60. Na/K 135/3.6. Rest of the CMP normal ?H/H 12/40. MCV 89. Platelets 306 ?HbA1C 5.4% ?Chol 216, TG 61, HDL 32, LDL 172 ?BNP 308 high ? ?Review of Systems  ?Cardiovascular:  Positive for chest pain (Chest wall pain with coughing, laughing etc). Negative for dyspnea on exertion, leg swelling, palpitations and syncope.  ? ?   ? ? ?Vitals:  ? 02/16/22 0830  ?BP: 140/79  ?Pulse: 84  ?Resp: 16  ?Temp: 97.6 ?F (36.4 ?C)  ?SpO2: 97%  ? ? ? ?Body mass index is 33.3 kg/m?. ?Filed Weights  ? 02/16/22 0830  ?Weight: 194 lb (88 kg)  ? ? ? ?Objective:  ? Physical Exam ?Vitals and nursing note reviewed.  ?Constitutional:   ?   General: She is not in acute distress. ?Neck:  ?   Vascular: No JVD.  ?Cardiovascular:  ?   Rate and Rhythm: Normal rate and regular rhythm.  ?   Pulses: Normal pulses.  ?   Heart sounds: Normal heart sounds. No murmur heard. ?Pulmonary:  ?   Effort: Pulmonary effort is normal.  ?   Breath sounds: Normal breath sounds. No wheezing or rales.  ?Musculoskeletal:  ?   Right lower leg: No edema.  ?   Left lower leg: No edema.  ? ?   ?  ICD-10-CM   ?1. Coronary artery disease involving native coronary artery of native heart without angina pectoris  I25.10 Lipid panel  ?  Lipoprotein A (LPA)  ?  ?2. Mixed hyperlipidemia  E78.2   ?  ?3. Essential hypertension  I10   ?  ?4. Cigarette nicotine dependence without complication  V76.160   ?  ? ?Meds ordered this encounter  ?Medications  ? nicotine polacrilex (NICORETTE) 2 MG gum  ?  Sig: Take 1 each (2 mg total) by mouth as needed for smoking cessation.  ?  Dispense:  60 tablet  ?  Refill:  2  ? ?Orders Placed This Encounter  ?Procedures  ? Lipid panel  ? Lipoprotein A (LPA)  ? ? ? ?Assessment & Recommendations:  ? ?41 y.o. Caucasian female, smoker smoker, new diagnosis mixed hyperlipidemia, hyperglycemia, with out of hospital cardiac arrest w/VF, anterolateral STEMI ?  ?CAD: ?[facility] VF arrest due to anterolateral STEMI  10/2021 ?Culprit vessel mid LAD with primary PCI  ?Aspiration throbectomy ?       PTCA and stent placement 2.75 X 34 mm Onyx drug-eluting stent ?       Post dilatation with 3.0X18 mm [place] balloon at 18 atm ?Residual dLAD and dRPDA disease best treated medically ?DAPT with Aspirin/Brilinta till 10/2022 ?Continue metoprolol succinate 50 mg daily, losartan 50 mg daily,  ?Okay to discontinue spironolactone 25 mg daily.  ?Continue Lipitor 80 mg daily. Chek clipid panel today. If LDL >70, will add Zetia/Repatha. ?Smoking cessation. Prescribed  nicotine gum  ? ?Tobacco cessation counseling: ? ?- Currently smoking 3 cigarettes/day   ?- Patient was informed of the dangers of tobacco abuse including stroke, cancer, and MI, as well as benefits of tobacco cessation. ?- Patient is willing to quit at this time. ?- Approximately 5 mins were spent counseling patient cessation techniques. We discussed various methods to help quit smoking, including deciding on a date to quit, joining a support group, pharmacological agents. Patient would like to use nicotine gum. ?- I will reassess her progress at the next follow-up visit ? ? ?Hypertension:: ?Fairly well controlled.   ? ?Mixed hyperlipidemia: ?As above ? ?F/u in 6 months ? ? ? ?Nigel Mormon, MD ?Pager: [phone] ?Office: [phone] ?

## 2022-02-18 ENCOUNTER — Other Ambulatory Visit: Payer: Self-pay

## 2022-02-18 LAB — LIPID PANEL
Chol/HDL Ratio: 4.5 ratio — ABNORMAL HIGH (ref 0.0–4.4)
Cholesterol, Total: 157 mg/dL (ref 100–199)
HDL: 35 mg/dL — ABNORMAL LOW (ref >39)
LDL Chol Calc (NIH): 98 mg/dL (ref 0–99)
Triglycerides: 133 mg/dL (ref 0–149)
VLDL Cholesterol Cal: 24 mg/dL (ref 5–40)

## 2022-02-18 LAB — LIPOPROTEIN A (LPA): Lipoprotein (a): 22.2 nmol/L (ref <75.0)

## 2022-02-18 MED ORDER — REPATHA SURECLICK 140 MG/ML ~~LOC~~ SOAJ: 140.0000 mg | SUBCUTANEOUS | 3 refills | Status: DC

## 2022-02-18 NOTE — Progress Notes (Signed)
Called pt to inform her abut her lab results. Pt agrees to use repatha instead of the Zetia

## 2022-02-18 NOTE — Progress Notes (Signed)
Ok I have send the medication

## 2022-02-18 NOTE — Progress Notes (Signed)
LDL improved to 98, but goal is <70. We could either add Zetia 10 mg pill or Repatha injection. Let me know your thoughts. ? ?Regards, ?Dr. Rosemary Holms ? ? ?

## 2022-02-18 NOTE — Progress Notes (Signed)
Can you follow up please? ? ?Thanks ?MJP ? ?

## 2022-07-25 ENCOUNTER — Telehealth: Payer: Self-pay

## 2022-07-25 NOTE — Telephone Encounter (Signed)
Patient called and states that she has been taking the Amlodipine 10 mg for about 11 months and Thursday of last week 07/21/2022 She started having a bad reaction, she states that her face,hands, and feet have been swelling and that she has been itching uncontrollably. She states that she stopped taking the Amlodipine yesterday and wanted to know what should she do?  Please advise

## 2022-07-25 NOTE — Telephone Encounter (Signed)
Please send 30 day refill to cover until she sees me on 10/19.  Thanks MJP

## 2022-07-25 NOTE — Telephone Encounter (Signed)
Patient states that she will not take anymore of the medication until she see's you.  Please advise

## 2022-07-26 NOTE — Telephone Encounter (Signed)
Patient is scheduling to see you for an appointment on 08/03/2022

## 2022-07-26 NOTE — Telephone Encounter (Signed)
See if I can see her sooner than 10/19 to avoid interruption in medication for too long.  Thanks MJP

## 2022-08-03 ENCOUNTER — Ambulatory Visit: Payer: Self-pay | Admitting: Cardiology

## 2022-08-03 ENCOUNTER — Encounter: Payer: Self-pay | Admitting: Cardiology

## 2022-08-03 VITALS — BP 136/82 | HR 73 | Temp 98.0°F | Resp 16 | Ht 64.0 in | Wt 199.0 lb

## 2022-08-03 DIAGNOSIS — E782 Mixed hyperlipidemia: Secondary | ICD-10-CM

## 2022-08-03 DIAGNOSIS — I10 Essential (primary) hypertension: Secondary | ICD-10-CM

## 2022-08-03 DIAGNOSIS — I2510 Atherosclerotic heart disease of native coronary artery without angina pectoris: Secondary | ICD-10-CM

## 2022-08-03 MED ORDER — METOPROLOL SUCCINATE ER 50 MG PO TB24: 50.0000 mg | ORAL_TABLET | Freq: Every day | ORAL | 3 refills | Status: DC

## 2022-08-03 MED ORDER — LOSARTAN POTASSIUM 50 MG PO TABS: 50.0000 mg | ORAL_TABLET | Freq: Every day | ORAL | 3 refills | Status: DC

## 2022-08-03 MED ORDER — ATORVASTATIN CALCIUM 80 MG PO TABS: 80.0000 mg | ORAL_TABLET | Freq: Every day | ORAL | 3 refills | Status: DC

## 2022-08-03 MED ORDER — FUROSEMIDE 20 MG PO TABS: 20.0000 mg | ORAL_TABLET | ORAL | 3 refills | Status: DC | PRN

## 2022-08-03 NOTE — Progress Notes (Signed)
Subjective:   Nicole Orr, female    DOB: 03/15/1981, 41 y.o.   MRN: [id number]   Chief Complaint  Patient presents with   Coronary Artery Disease   Follow-up    6 month     HPI  41 y.o. Caucasian female  with hypertension, mixed hyperlipidemia, tobacco dependence, admitted with [facility] with Vfib 2/2 acute anterior STEMI, now s/p PPCI to LAD  Patient has not had any chest pain, has stable mild exertional dyspnea. Unfortunately, she continues to smoke 1/PPD. She has also lost her home, and is now living with her mother. She also does not have insurance.  She reportedly had a reaction to amlodipine that caused rash, as well as lip swelling. She is now not taking amlodipine. Blood pressure is well controlled.     Current Outpatient Medications:    amLODipine (NORVASC) 10 MG tablet, Take 1 tablet (10 mg total) by mouth daily., Disp: 90 tablet, Rfl: 3   aspirin 81 MG EC tablet, Take 1 tablet (81 mg total) by mouth daily. Swallow whole., Disp: 90 tablet, Rfl: 3   Cholecalciferol (VITAMIN D3 PO), Take 1 capsule by mouth daily., Disp: , Rfl:    diphenhydrAMINE (BENADRYL) 25 MG tablet, Take 50 mg by mouth at bedtime., Disp: , Rfl:    losartan (COZAAR) 50 MG tablet, Take 1 tablet (50 mg total) by mouth daily., Disp: 90 tablet, Rfl: 3   metoprolol succinate (TOPROL-XL) 50 MG 24 hr tablet, Take 1 tablet (50 mg total) by mouth daily. Take with or immediately following a meal., Disp: 90 tablet, Rfl: 3   ticagrelor (BRILINTA) 90 MG TABS tablet, Take 1 tablet (90 mg total) by mouth 2 (two) times daily., Disp: 180 tablet, Rfl: 3   atorvastatin (LIPITOR) 80 MG tablet, Take 1 tablet (80 mg total) by mouth daily. (Patient not taking: Reported on 08/03/2022), Disp: 90 tablet, Rfl: 3   furosemide (LASIX) 20 MG tablet, Take 1 tablet (20 mg total) by mouth as needed., Disp: 60 tablet, Rfl: 3  Cardiovascular and other pertinent studies:  EKG 08/03/2022: Sinus rhythm 92 bpm Left atrial  enlargement Nonspecific T-abnormality   Echocardiogram 11/07/221:  1. Left ventricular ejection fraction, by estimation, is 50 to 55%. The  left ventricle has low normal function. The left ventricle demonstrates  regional wall motion abnormalities (see scoring diagram/findings for  description). There is mild left  ventricular hypertrophy. Left ventricular diastolic parameters are  consistent with Grade II diastolic dysfunction (pseudonormalization).  Elevated left atrial pressure. There is mild hypokinesis of the left  ventricular, mid-apical anteroseptal wall and  apical segment.   2. Right ventricular systolic function is normal. The right ventricular  size is normal.   3. The mitral valve is grossly normal. Mild mitral valve regurgitation.   4. The aortic valve is normal in structure. Aortic valve regurgitation is  not visualized. No aortic stenosis is present.   5. The inferior vena cava is dilated in size with <50% respiratory  variability, suggesting right atrial pressure of 15 mmHg.    Coronary intervention 11/06/2021: LM: Normal LAD: Mid LAD occlusion (culprit)         Distal LAD tandem 80% stenoses (non-culprit) LCX: No significant disease RCA: Dominant vessel. Distal RPDA 70% disease   Hypertensive 200/100 mmHg with LVEDP 34 mmHg at the beginning of the case BP improved to 150/90 mmHg after relief from chest pain   Reperfusion rhythm noted after revascularization, without any ventricular arrhythmia   Successful percutaneous coronary  intervention mLAD        Aspiration throbectomy        PTCA and stent placement 2.75 X 34 mm Onyx drug-eluting stent        Post dilatation with 3.0X18 mm [place] balloon at 18 atm   Recent labs: 11/08/2021: Glucose 95, BUN/Cr 13/0.9. EGFR >60. Na/K 135/3.6. Rest of the CMP normal H/H 12/40. MCV 89. Platelets 306 HbA1C 5.4% Chol 216, TG 61, HDL 32, LDL 172 BNP 308 high  Review of Systems  Cardiovascular:  Positive for chest pain (Chest wall  pain with coughing, laughing etc). Negative for dyspnea on exertion, leg swelling, palpitations and syncope.         Vitals:   08/03/22 1045  BP: 136/82  Pulse: 73  Resp: 16  Temp: 98 F (36.7 C)  SpO2: 96%     Body mass index is 34.16 kg/m. Filed Weights   08/03/22 1045  Weight: 199 lb (90.3 kg)     Objective:   Physical Exam Vitals and nursing note reviewed.  Constitutional:      General: She is not in acute distress. Neck:     Vascular: No JVD.  Cardiovascular:     Rate and Rhythm: Normal rate and regular rhythm.     Pulses: Normal pulses.     Heart sounds: Normal heart sounds. No murmur heard. Pulmonary:     Effort: Pulmonary effort is normal.     Breath sounds: Normal breath sounds. No wheezing or rales.  Musculoskeletal:     Right lower leg: No edema.     Left lower leg: No edema.         ICD-10-CM   1. Coronary artery disease involving native coronary artery of native heart without angina pectoris  I25.10 EKG 12-Lead     No orders of the defined types were placed in this encounter.  Orders Placed This Encounter  Procedures   EKG 12-Lead     Assessment & Recommendations:   41 y.o. Caucasian female, smoker, new diagnosis mixed hyperlipidemia, hyperglycemia, with out of hospital cardiac arrest w/VF, anterolateral STEMI   CAD: [facility] VF arrest due to anterolateral STEMI 10/2021 Culprit vessel mid LAD with primary PCI  Aspiration throbectomy        PTCA and stent placement 2.75 X 34 mm Onyx drug-eluting stent        Post dilatation with 3.0X18 mm [place] balloon at 18 atm Residual dLAD and dRPDA disease best treated medically DAPT with Aspirin/Brilinta till 10/2022 Continue metoprolol succinate 50 mg daily, losartan 50 mg daily, Lipitor 80 mg daily.  Will check if Repatha is an option in absence of insurance coverage. I have encouraged her to look into marketplace coverage. I will check bloodwork in 3 months after she has in[place].  I emphasized  importance of nicotine cessation again.   Hypertension: Fairly well controlled.    Mixed hyperlipidemia: As above  F/u in 3 months    Nigel Mormon, MD Pager: [phone] Office: [phone]

## 2022-08-18 ENCOUNTER — Ambulatory Visit: Payer: Self-pay | Admitting: Cardiology

## 2022-10-10 ENCOUNTER — Telehealth: Payer: Self-pay

## 2022-10-11 NOTE — Telephone Encounter (Signed)
error 

## 2022-11-09 ENCOUNTER — Encounter: Payer: Self-pay | Admitting: Cardiology

## 2022-11-09 ENCOUNTER — Ambulatory Visit: Payer: Medicaid Other | Admitting: Cardiology

## 2022-11-09 VITALS — BP 132/81 | HR 78 | Resp 16 | Ht 64.0 in | Wt 203.0 lb

## 2022-11-09 DIAGNOSIS — E782 Mixed hyperlipidemia: Secondary | ICD-10-CM

## 2022-11-09 DIAGNOSIS — I2510 Atherosclerotic heart disease of native coronary artery without angina pectoris: Secondary | ICD-10-CM

## 2022-11-09 DIAGNOSIS — I10 Essential (primary) hypertension: Secondary | ICD-10-CM

## 2022-11-09 MED ORDER — FUROSEMIDE 20 MG PO TABS: 20.0000 mg | ORAL_TABLET | ORAL | 3 refills | Status: DC | PRN

## 2022-11-09 MED ORDER — METOPROLOL SUCCINATE ER 50 MG PO TB24: 50.0000 mg | ORAL_TABLET | Freq: Every day | ORAL | 3 refills | Status: DC

## 2022-11-09 MED ORDER — EZETIMIBE 10 MG PO TABS: 10.0000 mg | ORAL_TABLET | Freq: Every day | ORAL | 3 refills | Status: DC

## 2022-11-09 MED ORDER — LOSARTAN POTASSIUM 50 MG PO TABS: 50.0000 mg | ORAL_TABLET | Freq: Every day | ORAL | 3 refills | Status: DC

## 2022-11-09 MED ORDER — CLOPIDOGREL BISULFATE 75 MG PO TABS: 75.0000 mg | ORAL_TABLET | Freq: Every day | ORAL | 3 refills | Status: DC

## 2022-11-09 NOTE — Progress Notes (Signed)
Subjective:   Nicole Orr, female    DOB: 05-Dec-1980, 42 y.o.   MRN: [id number]   Chief Complaint  Patient presents with   Coronary Artery Disease   Follow-up    3 month     HPI  42 y.o. Caucasian female  with hypertension, mixed hyperlipidemia, tobacco dependence, admitted with [facility] with Vfib 2/2 acute anterior STEMI, now s/p PPCI to LAD  It appears that patient has been take Brilinta only once a day for last 6 months, to somewhat rash on her medications.  She did not have insurance, but fortunately has Medicaid now.  She is doing well, denies any chest pain, shortness of breath symptoms.  She had side effects with various statins, causing headache, itching, myalgia.  She is not taking any statin at this time.  Positive note, she has quit smoking.    Current Outpatient Medications:    aspirin 81 MG EC tablet, Take 1 tablet (81 mg total) by mouth daily. Swallow whole., Disp: 90 tablet, Rfl: 3   atorvastatin (LIPITOR) 80 MG tablet, Take 1 tablet (80 mg total) by mouth daily., Disp: 90 tablet, Rfl: 3   Cholecalciferol (VITAMIN D3 PO), Take 1 capsule by mouth daily., Disp: , Rfl:    diphenhydrAMINE (BENADRYL) 25 MG tablet, Take 50 mg by mouth at bedtime., Disp: , Rfl:    furosemide (LASIX) 20 MG tablet, Take 1 tablet (20 mg total) by mouth as needed., Disp: 90 tablet, Rfl: 3   losartan (COZAAR) 50 MG tablet, Take 1 tablet (50 mg total) by mouth daily., Disp: 90 tablet, Rfl: 3   metoprolol succinate (TOPROL-XL) 50 MG 24 hr tablet, Take 1 tablet (50 mg total) by mouth daily. Take with or immediately following a meal., Disp: 90 tablet, Rfl: 3   ticagrelor (BRILINTA) 90 MG TABS tablet, Take 1 tablet (90 mg total) by mouth 2 (two) times daily., Disp: 180 tablet, Rfl: 3  Cardiovascular and other pertinent studies:  EKG 08/03/2022: Sinus rhythm 92 bpm Left atrial enlargement Nonspecific T-abnormality   Echocardiogram 11/07/221:  1. Left ventricular ejection fraction, by estimation,  is 50 to 55%. The  left ventricle has low normal function. The left ventricle demonstrates  regional wall motion abnormalities (see scoring diagram/findings for  description). There is mild left  ventricular hypertrophy. Left ventricular diastolic parameters are  consistent with Grade II diastolic dysfunction (pseudonormalization).  Elevated left atrial pressure. There is mild hypokinesis of the left  ventricular, mid-apical anteroseptal wall and  apical segment.   2. Right ventricular systolic function is normal. The right ventricular  size is normal.   3. The mitral valve is grossly normal. Mild mitral valve regurgitation.   4. The aortic valve is normal in structure. Aortic valve regurgitation is  not visualized. No aortic stenosis is present.   5. The inferior vena cava is dilated in size with <50% respiratory  variability, suggesting right atrial pressure of 15 mmHg.    Coronary intervention 11/06/2021: LM: Normal LAD: Mid LAD occlusion (culprit)         Distal LAD tandem 80% stenoses (non-culprit) LCX: No significant disease RCA: Dominant vessel. Distal RPDA 70% disease   Hypertensive 200/100 mmHg with LVEDP 34 mmHg at the beginning of the case BP improved to 150/90 mmHg after relief from chest pain   Reperfusion rhythm noted after revascularization, without any ventricular arrhythmia   Successful percutaneous coronary intervention mLAD        Aspiration throbectomy  PTCA and stent placement 2.75 X 34 mm Onyx drug-eluting stent        Post dilatation with 3.0X18 mm [place] balloon at 18 atm   Recent labs: 11/08/2021: Glucose 95, BUN/Cr 13/0.9. EGFR >60. Na/K 135/3.6. Rest of the CMP normal H/H 12/40. MCV 89. Platelets 306 HbA1C 5.4% Chol 216, TG 61, HDL 32, LDL 172 BNP 308 high  Review of Systems  Cardiovascular:  Negative for chest pain, dyspnea on exertion, leg swelling, palpitations and syncope.         Vitals:   11/09/22 0919  BP: (!) 143/80  Pulse: 82   Resp: 16  SpO2: 95%     Body mass index is 34.84 kg/m. Filed Weights   11/09/22 0919  Weight: 203 lb (92.1 kg)     Objective:   Physical Exam Vitals and nursing note reviewed.  Constitutional:      General: She is not in acute distress. Neck:     Vascular: No JVD.  Cardiovascular:     Rate and Rhythm: Normal rate and regular rhythm.     Pulses: Normal pulses.     Heart sounds: Normal heart sounds. No murmur heard. Pulmonary:     Effort: Pulmonary effort is normal.     Breath sounds: Normal breath sounds. No wheezing or rales.  Musculoskeletal:     Right lower leg: No edema.     Left lower leg: No edema.         ICD-10-CM   1. Mixed hyperlipidemia  E78.2     2. Coronary artery disease involving native coronary artery of native heart without angina pectoris  I25.10 metoprolol succinate (TOPROL-XL) 50 MG 24 hr tablet    losartan (COZAAR) 50 MG tablet    Lipid panel    Lipid panel    Basic metabolic panel    Lipid panel    3. Essential hypertension  I10       Meds ordered this encounter  Medications   clopidogrel (PLAVIX) 75 MG tablet    Sig: Take 1 tablet (75 mg total) by mouth daily.    Dispense:  90 tablet    Refill:  3   ezetimibe (ZETIA) 10 MG tablet    Sig: Take 1 tablet (10 mg total) by mouth daily.    Dispense:  90 tablet    Refill:  3   furosemide (LASIX) 20 MG tablet    Sig: Take 1 tablet (20 mg total) by mouth as needed.    Dispense:  60 tablet    Refill:  3   metoprolol succinate (TOPROL-XL) 50 MG 24 hr tablet    Sig: Take 1 tablet (50 mg total) by mouth daily. Take with or immediately following a meal.    Dispense:  90 tablet    Refill:  3   losartan (COZAAR) 50 MG tablet    Sig: Take 1 tablet (50 mg total) by mouth daily.    Dispense:  90 tablet    Refill:  3   Orders Placed This Encounter  Procedures   Lipid panel   Lipid panel   Basic metabolic panel     Assessment & Recommendations:   42 y.o. Caucasian female, smoker, new  diagnosis mixed hyperlipidemia, hyperglycemia, with out of hospital cardiac arrest w/VF, anterolateral STEMI   CAD: [facility] VF arrest due to anterolateral STEMI 10/2021 Culprit vessel mid LAD with primary PCI  Aspiration throbectomy        PTCA and stent placement 2.75 X 34 mm  Onyx drug-eluting stent        Post dilatation with 3.0X18 mm [place] balloon at 18 atm Residual dLAD and dRPDA disease best treated medically Patient took DAPT for 6 months as recommended, only took Brilinta once daily since then. Given her known culprit residual disease, I recommend monotherapy with Plavix from now onwards, indefinitely. Continue metoprolol succinate 50 mg daily, losartan 50 mg daily. Intolerant to statins due to myalgia, headache, itching.  Started Zetia 10 mg daily.  Will also seek patient assistance for Repatha, or Leqvio in that order.  Check lipid panel now and repeat in 3 months. I congratulated her on quitting smoking.  Hypertension: Fairly well controlled.    Mixed hyperlipidemia: As above  F/u in 3 months    Elder Negus, MD Pager: [phone] Office: [phone]

## 2022-12-01 LAB — BASIC METABOLIC PANEL
BUN/Creatinine Ratio: 7 — ABNORMAL LOW (ref 9–23)
BUN: 5 mg/dL — ABNORMAL LOW (ref 6–24)
CO2: 22 mmol/L (ref 20–29)
Calcium: 9.4 mg/dL (ref 8.7–10.2)
Chloride: 104 mmol/L (ref 96–106)
Creatinine, Ser: 0.7 mg/dL (ref 0.57–1.00)
Glucose: 90 mg/dL (ref 70–99)
Potassium: 4.4 mmol/L (ref 3.5–5.2)
Sodium: 140 mmol/L (ref 134–144)
eGFR: 110 mL/min/{1.73_m2} (ref >59)

## 2022-12-01 LAB — LIPID PANEL
Chol/HDL Ratio: 5.7 ratio — ABNORMAL HIGH (ref 0.0–4.4)
Cholesterol, Total: 178 mg/dL (ref 100–199)
HDL: 31 mg/dL — ABNORMAL LOW (ref >39)
LDL Chol Calc (NIH): 122 mg/dL — ABNORMAL HIGH (ref 0–99)
Triglycerides: 135 mg/dL (ref 0–149)
VLDL Cholesterol Cal: 25 mg/dL (ref 5–40)

## 2022-12-01 NOTE — Progress Notes (Signed)
Cholesterol remains elevated. We will look at [place] or Leqvio prior authorization. Please let the patient know.  Thanks MJP

## 2022-12-05 NOTE — Progress Notes (Signed)
Patient called back and was informed about the message above about her labs

## 2022-12-05 NOTE — Progress Notes (Signed)
Called patient no answer left a vm

## 2022-12-11 ENCOUNTER — Other Ambulatory Visit: Payer: Self-pay | Admitting: Cardiology

## 2022-12-11 DIAGNOSIS — I2510 Atherosclerotic heart disease of native coronary artery without angina pectoris: Secondary | ICD-10-CM

## 2023-03-09 ENCOUNTER — Ambulatory Visit: Payer: Medicaid Other | Admitting: Cardiology

## 2023-03-09 NOTE — Progress Notes (Deleted)
Subjective:   Nicole Orr, female    DOB: 1981/01/15, 42 y.o.   MRN: [id number]   No chief complaint on file.    HPI  42 y.o. Caucasian female  with hypertension, mixed hyperlipidemia, tobacco dependence, admitted with [facility] with Vfib 2/2 acute anterior STEMI, now s/p PPCI to LAD  It appears that patient has been take Brilinta only once a day for last 6 months, to somewhat rash on her medications.  She did not have insurance, but fortunately has Medicaid now.  She is doing well, denies any chest pain, shortness of breath symptoms.  She had side effects with various statins, causing headache, itching, myalgia.  She is not taking any statin at this time.  Positive note, she has quit smoking.    Current Outpatient Medications:    Cholecalciferol (VITAMIN D3 PO), Take 1 capsule by mouth daily., Disp: , Rfl:    clopidogrel (PLAVIX) 75 MG tablet, Take 1 tablet (75 mg total) by mouth daily., Disp: 90 tablet, Rfl: 3   diphenhydrAMINE (BENADRYL) 25 MG tablet, Take 50 mg by mouth at bedtime., Disp: , Rfl:    ezetimibe (ZETIA) 10 MG tablet, Take 1 tablet (10 mg total) by mouth daily., Disp: 90 tablet, Rfl: 3   furosemide (LASIX) 20 MG tablet, Take 1 tablet (20 mg total) by mouth as needed., Disp: 60 tablet, Rfl: 3   losartan (COZAAR) 50 MG tablet, Take 1 tablet (50 mg total) by mouth daily., Disp: 90 tablet, Rfl: 3   metoprolol succinate (TOPROL-XL) 50 MG 24 hr tablet, Take 1 tablet (50 mg total) by mouth daily. Take with or immediately following a meal., Disp: 90 tablet, Rfl: 3  Cardiovascular and other pertinent studies:  EKG 08/03/2022: Sinus rhythm 92 bpm Left atrial enlargement Nonspecific T-abnormality   Echocardiogram 11/07/221:  1. Left ventricular ejection fraction, by estimation, is 50 to 55%. The  left ventricle has low normal function. The left ventricle demonstrates  regional wall motion abnormalities (see scoring diagram/findings for  description). There is mild left   ventricular hypertrophy. Left ventricular diastolic parameters are  consistent with Grade II diastolic dysfunction (pseudonormalization).  Elevated left atrial pressure. There is mild hypokinesis of the left  ventricular, mid-apical anteroseptal wall and  apical segment.   2. Right ventricular systolic function is normal. The right ventricular  size is normal.   3. The mitral valve is grossly normal. Mild mitral valve regurgitation.   4. The aortic valve is normal in structure. Aortic valve regurgitation is  not visualized. No aortic stenosis is present.   5. The inferior vena cava is dilated in size with <50% respiratory  variability, suggesting right atrial pressure of 15 mmHg.    Coronary intervention 11/06/2021: LM: Normal LAD: Mid LAD occlusion (culprit)         Distal LAD tandem 80% stenoses (non-culprit) LCX: No significant disease RCA: Dominant vessel. Distal RPDA 70% disease   Hypertensive 200/100 mmHg with LVEDP 34 mmHg at the beginning of the case BP improved to 150/90 mmHg after relief from chest pain   Reperfusion rhythm noted after revascularization, without any ventricular arrhythmia   Successful percutaneous coronary intervention mLAD        Aspiration throbectomy        PTCA and stent placement 2.75 X 34 mm Onyx drug-eluting stent        Post dilatation with 3.0X18 mm [place] balloon at 18 atm   Recent labs: 11/08/2021: Glucose 95, BUN/Cr 13/0.9. EGFR >60. Na/K 135/3.6.  Rest of the CMP normal H/H 12/40. MCV 89. Platelets 306 HbA1C 5.4% Chol 216, TG 61, HDL 32, LDL 172 BNP 308 high  Review of Systems  Cardiovascular:  Negative for chest pain, dyspnea on exertion, leg swelling, palpitations and syncope.         There were no vitals filed for this visit.    There is no height or weight on file to calculate BMI. There were no vitals filed for this visit.    Objective:   Physical Exam Vitals and nursing note reviewed.  Constitutional:      General: She  is not in acute distress. Neck:     Vascular: No JVD.  Cardiovascular:     Rate and Rhythm: Normal rate and regular rhythm.     Pulses: Normal pulses.     Heart sounds: Normal heart sounds. No murmur heard. Pulmonary:     Effort: Pulmonary effort is normal.     Breath sounds: Normal breath sounds. No wheezing or rales.  Musculoskeletal:     Right lower leg: No edema.     Left lower leg: No edema.       No diagnosis found.   No orders of the defined types were placed in this encounter.  No orders of the defined types were placed in this encounter.    Assessment & Recommendations:   42 y.o. Caucasian female, smoker, new diagnosis mixed hyperlipidemia, hyperglycemia, with out of hospital cardiac arrest w/VF, anterolateral STEMI   CAD: [facility] VF arrest due to anterolateral STEMI 10/2021 Culprit vessel mid LAD with primary PCI  Aspiration throbectomy        PTCA and stent placement 2.75 X 34 mm Onyx drug-eluting stent        Post dilatation with 3.0X18 mm [place] balloon at 18 atm Residual dLAD and dRPDA disease best treated medically Patient took DAPT for 6 months as recommended, only took Brilinta once daily since then. Given her known culprit residual disease, I recommend monotherapy with Plavix from now onwards, indefinitely. Continue metoprolol succinate 50 mg daily, losartan 50 mg daily. Intolerant to statins due to myalgia, headache, itching.  Started Zetia 10 mg daily.  Will also seek patient assistance for Repatha, or Leqvio in that order.  Check lipid panel now and repeat in 3 months. I congratulated her on quitting smoking.  Hypertension: Fairly well controlled.    Mixed hyperlipidemia: As above  F/u in 3 months    Elder Negus, MD Pager: [phone] Office: [phone]

## 2023-04-10 ENCOUNTER — Encounter: Payer: Medicaid Other | Admitting: Cardiology

## 2023-05-18 ENCOUNTER — Encounter: Payer: Self-pay | Admitting: Cardiology

## 2023-05-18 ENCOUNTER — Ambulatory Visit: Payer: Self-pay | Admitting: Cardiology

## 2023-05-18 VITALS — BP 194/97 | HR 70 | Resp 16 | Ht 64.0 in | Wt 208.0 lb

## 2023-05-18 DIAGNOSIS — I10 Essential (primary) hypertension: Secondary | ICD-10-CM

## 2023-05-18 DIAGNOSIS — E782 Mixed hyperlipidemia: Secondary | ICD-10-CM

## 2023-05-18 DIAGNOSIS — I2510 Atherosclerotic heart disease of native coronary artery without angina pectoris: Secondary | ICD-10-CM

## 2023-05-18 MED ORDER — METOPROLOL SUCCINATE ER 50 MG PO TB24: 50.0000 mg | ORAL_TABLET | Freq: Every day | ORAL | 3 refills | Status: DC

## 2023-05-18 MED ORDER — FUROSEMIDE 20 MG PO TABS: 20.0000 mg | ORAL_TABLET | Freq: Every day | ORAL | 3 refills | Status: DC

## 2023-05-18 MED ORDER — CLOPIDOGREL BISULFATE 75 MG PO TABS: 75.0000 mg | ORAL_TABLET | Freq: Every day | ORAL | 3 refills | Status: DC

## 2023-05-18 MED ORDER — EZETIMIBE 10 MG PO TABS: 10.0000 mg | ORAL_TABLET | Freq: Every day | ORAL | 3 refills | Status: DC

## 2023-05-18 MED ORDER — LOSARTAN POTASSIUM 50 MG PO TABS: 50.0000 mg | ORAL_TABLET | Freq: Every day | ORAL | 3 refills | Status: DC

## 2023-05-18 NOTE — Progress Notes (Signed)
Subjective:   Nicole Orr, female    DOB: August 27, 1981, 42 y.o.   MRN: [id number]   Chief Complaint  Patient presents with   Coronary artery disease involving native coronary artery of   Follow-up     HPI  42 y.o. Caucasian female  with hypertension, mixed hyperlipidemia, former smoker, h/o anterior STEMI, V-fib arrest (10/2021) treated with PPCI to LAD  Patient is here with her daughter today.  She has been doing fairly well from cardiac standpoint and denies any chest pain or shortness of breath.  She is walking short distances for 10 minutes without any complaints.  Currently, she has been experiencing eczema flareup for last 2 weeks which is called a rash all over her body with itching, as well as a blister on her sole.  Unfortunately, she does not have insurance and has not sought any medical care for this.  She has been applying [employer] for the same.  For the.  That she has had eczema flareup, her blood pressure has been elevated.  On a positive note, she has not been smoking since 10/2022.  She has been compliant with medical therapy.     Current Outpatient Medications:    clopidogrel (PLAVIX) 75 MG tablet, Take 1 tablet (75 mg total) by mouth daily., Disp: 90 tablet, Rfl: 3   diphenhydrAMINE (BENADRYL) 25 MG tablet, Take 50 mg by mouth at bedtime., Disp: , Rfl:    ezetimibe (ZETIA) 10 MG tablet, Take 1 tablet (10 mg total) by mouth daily., Disp: 90 tablet, Rfl: 3   furosemide (LASIX) 20 MG tablet, Take 1 tablet (20 mg total) by mouth as needed., Disp: 60 tablet, Rfl: 3   losartan (COZAAR) 50 MG tablet, Take 1 tablet (50 mg total) by mouth daily., Disp: 90 tablet, Rfl: 3   metoprolol succinate (TOPROL-XL) 50 MG 24 hr tablet, Take 1 tablet (50 mg total) by mouth daily. Take with or immediately following a meal., Disp: 90 tablet, Rfl: 3  Cardiovascular and other pertinent studies:  EKG 08/03/2022: Sinus rhythm 92 bpm Left atrial enlargement Nonspecific T-abnormality    Echocardiogram 11/07/221:  1. Left ventricular ejection fraction, by estimation, is 50 to 55%. The  left ventricle has low normal function. The left ventricle demonstrates  regional wall motion abnormalities (see scoring diagram/findings for  description). There is mild left  ventricular hypertrophy. Left ventricular diastolic parameters are  consistent with Grade II diastolic dysfunction (pseudonormalization).  Elevated left atrial pressure. There is mild hypokinesis of the left  ventricular, mid-apical anteroseptal wall and  apical segment.   2. Right ventricular systolic function is normal. The right ventricular  size is normal.   3. The mitral valve is grossly normal. Mild mitral valve regurgitation.   4. The aortic valve is normal in structure. Aortic valve regurgitation is  not visualized. No aortic stenosis is present.   5. The inferior vena cava is dilated in size with <50% respiratory  variability, suggesting right atrial pressure of 15 mmHg.    Coronary intervention 11/06/2021: LM: Normal LAD: Mid LAD occlusion (culprit)         Distal LAD tandem 80% stenoses (non-culprit) LCX: No significant disease RCA: Dominant vessel. Distal RPDA 70% disease   Hypertensive 200/100 mmHg with LVEDP 34 mmHg at the beginning of the case BP improved to 150/90 mmHg after relief from chest pain   Reperfusion rhythm noted after revascularization, without any ventricular arrhythmia   Successful percutaneous coronary intervention mLAD  Aspiration throbectomy        PTCA and stent placement 2.75 X 34 mm Onyx drug-eluting stent        Post dilatation with 3.0X18 mm [place] balloon at 18 atm   Recent labs: 11/30/2022: Glucose 90, BUN/Cr 5/0.7. EGFR 110. Na/K 140/4.4.  Chol 178, TG 135, HDL 31, LDL 122  11/08/2021: Glucose 95, BUN/Cr 13/0.9. EGFR >60. Na/K 135/3.6. Rest of the CMP normal H/H 12/40. MCV 89. Platelets 306 HbA1C 5.4% Chol 216, TG 61, HDL 32, LDL 172 BNP 308 high  Review of  Systems  Cardiovascular:  Negative for chest pain, dyspnea on exertion, leg swelling, palpitations and syncope.  Skin:  Positive for rash.         Vitals:   05/18/23 0941 05/18/23 0942  BP: (!) 193/100 (!) 194/97  Pulse: 77 70  Resp: 16   SpO2: 97% 97%      Body mass index is 35.7 kg/m. Filed Weights   05/18/23 0941  Weight: 208 lb (94.3 kg)      Objective:   Physical Exam Vitals and nursing note reviewed.  Constitutional:      General: She is not in acute distress. Neck:     Vascular: No JVD.  Cardiovascular:     Rate and Rhythm: Normal rate and regular rhythm.     Pulses: Normal pulses.     Heart sounds: Normal heart sounds. No murmur heard. Pulmonary:     Effort: Pulmonary effort is normal.     Breath sounds: Normal breath sounds. No wheezing or rales.  Musculoskeletal:     Right lower leg: No edema.     Left lower leg: No edema.  Skin:    Findings: Rash (Eczema flareup, sole blister) present.     Visit diagnoses:   ICD-10-CM   1. Coronary artery disease involving native coronary artery of native heart without angina pectoris  I25.10 metoprolol succinate (TOPROL-XL) 50 MG 24 hr tablet    losartan (COZAAR) 50 MG tablet    2. Mixed hyperlipidemia  E78.2     3. Essential hypertension  I10        Meds ordered this encounter  Medications   metoprolol succinate (TOPROL-XL) 50 MG 24 hr tablet    Sig: Take 1 tablet (50 mg total) by mouth daily. Take 2 pills during eczema flareup/stress that would increase your blood pressure    Dispense:  90 tablet    Refill:  3   losartan (COZAAR) 50 MG tablet    Sig: Take 1 tablet (50 mg total) by mouth daily.    Dispense:  90 tablet    Refill:  3   furosemide (LASIX) 20 MG tablet    Sig: Take 1 tablet (20 mg total) by mouth daily.    Dispense:  90 tablet    Refill:  3   ezetimibe (ZETIA) 10 MG tablet    Sig: Take 1 tablet (10 mg total) by mouth daily.    Dispense:  90 tablet    Refill:  3   clopidogrel  (PLAVIX) 75 MG tablet    Sig: Take 1 tablet (75 mg total) by mouth daily.    Dispense:  90 tablet    Refill:  3   No orders of the defined types were placed in this encounter.    Assessment & Recommendations:   42 y.o. Caucasian female  with hypertension, mixed hyperlipidemia, former smoker, h/o anterior STEMI, V-fib arrest (10/2021) treated with PPCI to LAD   CAD:  [facility] VF arrest due to anterolateral STEMI 10/2021 Culprit vessel mid LAD with primary PCI  Aspiration throbectomy        PTCA and stent placement 2.75 X 34 mm Onyx drug-eluting stent        Post dilatation with 3.0X18 mm [place] balloon at 18 atm Residual dLAD and dRPDA disease best treated medically. Tolerating Plavix monotherapy, continue the same. Continue metoprolol succinate 50 mg daily, losartan 50 mg daily, Zetia 10 mg daily. However, during episodes of eczema flareup or other stress that increases blood pressure, I have asked her to take 2 pills of metoprolol succinate 50 mg daily in hopes of controlling blood pressure without adding additional labs (patient has no insurance). Will work on getting patient assistance for Repatha.  Eczema rash: I have strongly encouraged her to seek medical treatment for the same. Also, I have strongly encouraged her to contact Medicaid again.  I do not see any reason why she should not be approved for Medicaid in absence of income.  Hypertension: Uncontrolled. Suspect eczema rash, pain from her sole could be contributing.  She will take metoprolol succinate 2 tablets of 50 mg daily during this period.  She will update me on MyChart in a few days regarding her blood pressure control.  Mixed hyperlipidemia: As above  F/u in 3 months    Elder Negus, MD Pager: [phone] Office: [phone]

## 2023-06-12 IMAGING — DX DG CHEST 1V PORT
1 series · 1 of 1 positions shown · non-contrast
Comparison: November 06, 2021.

CLINICAL DATA: Status post catheterization.

EXAM:
PORTABLE CHEST 1 VIEW

[chest ap]
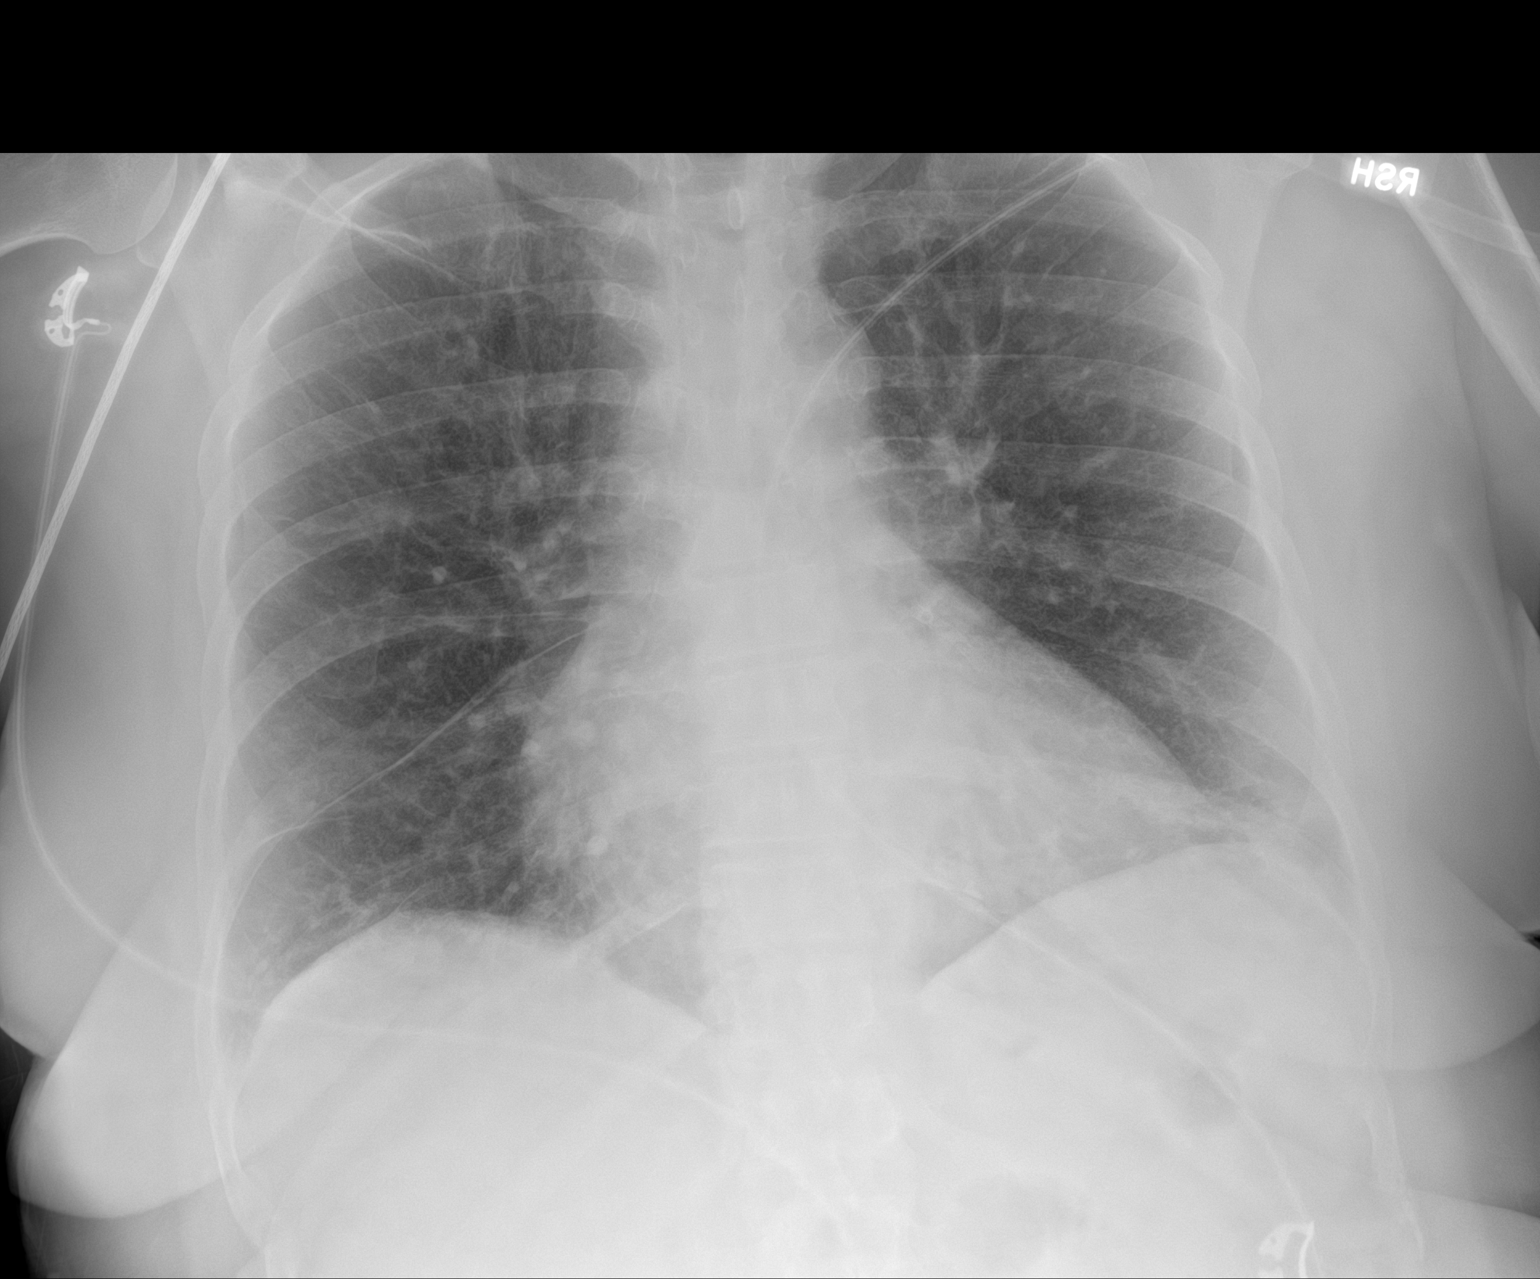

[1 of 1 positions shown; findings below may reference images not displayed]

FINDINGS: Stable cardiomediastinal silhouette. Minimal bibasilar subsegmental
atelectasis is noted. Bony thorax is unremarkable.
IMPRESSION: Minimal bibasilar subsegmental atelectasis.

## 2023-07-07 NOTE — Progress Notes (Signed)
This encounter was created in error - please disregard.

## 2023-08-13 ENCOUNTER — Other Ambulatory Visit: Payer: Self-pay | Admitting: Cardiology

## 2023-08-17 ENCOUNTER — Ambulatory Visit: Payer: MEDICAID | Admitting: Cardiology

## 2023-09-15 ENCOUNTER — Other Ambulatory Visit: Payer: Self-pay

## 2023-11-10 ENCOUNTER — Other Ambulatory Visit: Payer: Self-pay | Admitting: Cardiology

## 2023-11-10 DIAGNOSIS — I2510 Atherosclerotic heart disease of native coronary artery without angina pectoris: Secondary | ICD-10-CM

## 2023-11-16 ENCOUNTER — Other Ambulatory Visit: Payer: Self-pay | Admitting: Cardiology

## 2023-11-16 DIAGNOSIS — I2510 Atherosclerotic heart disease of native coronary artery without angina pectoris: Secondary | ICD-10-CM

## 2023-11-17 ENCOUNTER — Other Ambulatory Visit: Payer: Self-pay | Admitting: Cardiology

## 2023-11-23 ENCOUNTER — Ambulatory Visit: Payer: Self-pay | Attending: Cardiology | Admitting: Cardiology

## 2023-11-23 ENCOUNTER — Encounter: Payer: Self-pay | Admitting: Cardiology

## 2023-11-23 VITALS — BP 210/108 | HR 82 | Resp 16 | Ht 64.0 in | Wt 210.0 lb

## 2023-11-23 DIAGNOSIS — I10 Essential (primary) hypertension: Secondary | ICD-10-CM

## 2023-11-23 DIAGNOSIS — E782 Mixed hyperlipidemia: Secondary | ICD-10-CM

## 2023-11-23 DIAGNOSIS — R0609 Other forms of dyspnea: Secondary | ICD-10-CM | POA: Insufficient documentation

## 2023-11-23 DIAGNOSIS — Z131 Encounter for screening for diabetes mellitus: Secondary | ICD-10-CM | POA: Insufficient documentation

## 2023-11-23 DIAGNOSIS — Z79899 Other long term (current) drug therapy: Secondary | ICD-10-CM | POA: Insufficient documentation

## 2023-11-23 DIAGNOSIS — I2510 Atherosclerotic heart disease of native coronary artery without angina pectoris: Secondary | ICD-10-CM

## 2023-11-23 LAB — BASIC METABOLIC PANEL
BUN/Creatinine Ratio: 10 (ref 9–23)
BUN: 7 mg/dL (ref 6–24)
CO2: 23 mmol/L (ref 20–29)
Calcium: 9.3 mg/dL (ref 8.7–10.2)
Chloride: 102 mmol/L (ref 96–106)
Creatinine, Ser: 0.71 mg/dL (ref 0.57–1.00)
Glucose: 79 mg/dL (ref 70–99)
Potassium: 4.1 mmol/L (ref 3.5–5.2)
Sodium: 140 mmol/L (ref 134–144)
eGFR: 107 mL/min/{1.73_m2} (ref >59)

## 2023-11-23 LAB — LIPID PANEL
Chol/HDL Ratio: 8 {ratio} — ABNORMAL HIGH (ref 0.0–4.4)
Cholesterol, Total: 240 mg/dL — ABNORMAL HIGH (ref 100–199)
HDL: 30 mg/dL — ABNORMAL LOW (ref >39)
LDL Chol Calc (NIH): 180 mg/dL — ABNORMAL HIGH (ref 0–99)
Triglycerides: 160 mg/dL — ABNORMAL HIGH (ref 0–149)
VLDL Cholesterol Cal: 30 mg/dL (ref 5–40)

## 2023-11-23 LAB — PRO B NATRIURETIC PEPTIDE: NT-Pro BNP: 999 pg/mL — ABNORMAL HIGH (ref 0–130)

## 2023-11-23 MED ORDER — ROSUVASTATIN CALCIUM 40 MG PO TABS: 40.0000 mg | ORAL_TABLET | Freq: Every day | ORAL | 3 refills | Status: DC

## 2023-11-23 MED ORDER — METOPROLOL SUCCINATE ER 100 MG PO TB24: 100.0000 mg | ORAL_TABLET | Freq: Every day | ORAL | 3 refills | Status: DC

## 2023-11-23 MED ORDER — AMLODIPINE BESYLATE 10 MG PO TABS: 10.0000 mg | ORAL_TABLET | Freq: Every day | ORAL | 3 refills | Status: DC

## 2023-11-23 NOTE — Progress Notes (Signed)
Cardiology Office Note:  .   Date:  11/23/2023  ID:  Nicole Orr, DOB [birth year], MRN [id number] PCP: Patient, No Pcp Per   [facility] Providers Cardiologist:  Truett Mainland, MD PCP: Patient, No Pcp Per  Chief Complaint  Patient presents with   Coronary artery disease involving native coronary artery of   Follow-up    3 months      History of Present Illness: .    Nicole Orr is a 43 y.o. female with hypertension, mixed hyperlipidemia, former smoker, h/o anterior STEMI, V-fib arrest (10/2021) treated with PPCI to LAD, eczema  Patient is here with her daughter Maralyn Sago today.  Patient is not seen any medical provider since her last visit with me in 05/2023.  She continues to suffer from eczema which she attributes to premenopause.  Due to severe eczema rash on arms, she stopped checking her blood pressure at home.  Her blood pressure remains elevated, higher than what it was during her visit in 05/2023.  She denies any chest pain.  She does report exertional dyspnea.  She is compliant with her medical therapy.  She reportedly applied for Medicaid 5 months ago, but was given a very limited family plan that only takes care of reproductive health.  Because of her eczema, she has not been able to get a job in [place], previously worked in [employer] at [employer].  Vitals:   11/23/23 0929 11/23/23 0936  BP: (!) 215/103 (!) 210/108  Pulse: 80 82  Resp: 16   SpO2: 96%      ROS:  Review of Systems  Cardiovascular:  Positive for dyspnea on exertion. Negative for chest pain, leg swelling, palpitations and syncope.     Studies Reviewed: Marland Kitchen        EKG 11/23/2023: Normal sinus rhythm Cannot rule out Anterior infarct , age undetermined T wave abnormality, consider lateral ischemia When compared with ECG of 07-Nov-2021 07:04, ST no longer elevated in Anterior leads T wave inversion no longer evident in Anterior leads QT has shortened    Independently  interpreted 10/2022: Chol 178, TG 135, HDL 31, LDL 122 Cr 0.7   10/2021: Hb 12.7 HbA1C 5.4%   Physical Exam:   Physical Exam Vitals and nursing note reviewed.  Constitutional:      General: She is not in acute distress. Neck:     Vascular: No JVD.  Cardiovascular:     Rate and Rhythm: Normal rate and regular rhythm.     Heart sounds: Normal heart sounds. No murmur heard. Pulmonary:     Effort: Pulmonary effort is normal.     Breath sounds: Normal breath sounds. No wheezing or rales.  Musculoskeletal:     Right lower leg: No edema.     Left lower leg: No edema.  Skin:    Findings: Rash present.      VISIT DIAGNOSES:   ICD-10-CM   1. Coronary artery disease involving native coronary artery of native heart without angina pectoris  I25.10 EKG 12-Lead    2. Mixed hyperlipidemia  E78.2     3. Essential hypertension  I10        ASSESSMENT AND PLAN: .    Nicole Orr is a 43 y.o. female with hypertension, mixed hyperlipidemia, former smoker, h/o anterior STEMI, V-fib arrest (10/2021) treated with PPCI to LAD, eczema    CAD: [facility] VF arrest due to anterolateral STEMI 10/2021 Culprit vessel mid LAD with primary PCI  Aspiration throbectomy  PTCA and stent placement 2.75 X 34 mm Onyx drug-eluting stent        Post dilatation with 3.0X18 mm  balloon at 18 atm Residual dLAD and dRPDA disease best treated medically. Tolerating Plavix monotherapy, continue the same. See below regarding hypertension and hyperlipidemia regimen. Dyspnea on exertion without any heart failure signs on exam. Check proBNP, BMP. I suspect uncontrolled hypertension most likely cause of dyspnea.  However, I would like to check an echocardiogram, hopefully after she has full Medicaid plan, 3 months from now.  Hypertension: Malignant hypertension, fortunately without any hypertensive urgency signs or symptoms. Increase metoprolol succinate to 100 mg daily. Continue losartan 50 mg  daily. Added amlodipine 10 mg daily.   Follow-up visit with Pharm.D. in 1 month to monitor blood pressure.  Mixed hyperlipidemia: She stopped statin when she developed eczema.  Eczema has persisted in spite of without being on statin for several months.  Currently, she is only taking Zetia 10 mg daily.  Added Crestor 40 mg daily. Check baseline lipid panel.   I have urged the patient to contact Medicaid office again to try and get full Medicaid plan and establish care with a primary care provider who can monitor patient's eczema, as well as hypertension.    Meds ordered this encounter  Medications   metoprolol succinate (TOPROL-XL) 100 MG 24 hr tablet    Sig: Take 1 tablet (100 mg total) by mouth daily. Take with or immediately following a meal.    Dispense:  90 tablet    Refill:  3    DOSE INCREASE   amLODipine (NORVASC) 10 MG tablet    Sig: Take 1 tablet (10 mg total) by mouth daily.    Dispense:  90 tablet    Refill:  3   rosuvastatin (CRESTOR) 40 MG tablet    Sig: Take 1 tablet (40 mg total) by mouth daily.    Dispense:  90 tablet    Refill:  3     F/u in 1 month w/pharm D, 6 months w.me  Signed, Elder Negus, MD

## 2023-11-23 NOTE — Patient Instructions (Signed)
Medication Instructions:   INCREASE YOUR METOPROLOL SUCCINATE (TOPROL XL) TO 100 MG BY MOUTH DAILY  START TAKING ROSUVASTATIN (CRESTOR) 40 MG BY MOUTH DAILY  START TAKING AMLODIPINE 10 MG BY MOUTH DAILY  *If you need a refill on your cardiac medications before your next appointment, please call your pharmacy*    You have been referred to OUR [facility] TO SEE THE PHARMACIST IN ONE MONTH    Lab Work:  [facility] FIRST FLOOR AT [facility]--PRO-BNP, BMET, AND LIPIDS  If you have labs (blood work) drawn today and your tests are completely normal, you will receive your results only by: MyChart Message (if you have MyChart) OR A paper copy in the mail If you have any lab test that is abnormal or we need to change your treatment, we will call you to review the results.    Testing/Procedures:  Your physician has requested that you have an echocardiogram. Echocardiography is a painless test that uses sound waves to create images of your heart. It provides your doctor with information about the size and shape of your heart and how well your heart's chambers and valves are working. This procedure takes approximately one hour. There are no restrictions for this procedure.  SCHEDULE ECHO IN 3 MONTHS  Please do NOT wear cologne, perfume, aftershave, or lotions (deodorant is allowed). Please arrive 15 minutes prior to your appointment time.  Please note: We ask at that you not bring children with you during ultrasound (echo/ vascular) testing. Due to room size and safety concerns, children are not allowed in the ultrasound rooms during exams. Our front office staff cannot provide observation of children in our lobby area while testing is being conducted. An adult accompanying a patient to their appointment will only be allowed in the ultrasound room at the discretion of the ultrasound technician under special circumstances. We apologize for any inconvenience.    Follow-Up: At  [facility], you and your health needs are our priority.  As part of our continuing mission to provide you with exceptional heart care, we have created designated Provider Care Teams.  These Care Teams include your primary Cardiologist (physician) and Advanced Practice Providers (APPs -  Physician Assistants and Nurse Practitioners) who all work together to provide you with the care you need, when you need it.  We recommend signing up for the patient portal called "MyChart".  Sign up information is provided on this After Visit Summary.  MyChart is used to connect with patients for Virtual Visits (Telemedicine).  Patients are able to view lab/test results, encounter notes, upcoming appointments, etc.  Non-urgent messages can be sent to your provider as well.   To learn more about what you can do with MyChart, go to [email or web address].    Your next appointment:   6 month(s)  Provider:   DR. Rosemary Holms   Other Instructions  CHECK INTO [facility] WITH [facility]  CHECK IN WITH THE DEPARTMENT OF SOCIAL SERVICES FOR FULL MEDICAID OR PUBLIC HEALTH

## 2023-11-24 NOTE — Progress Notes (Signed)
Cholesterol is uncontrolled. Start Crestor 40 mg daily. Repeat lipid panel in 1 month. If cholesterol remains uncontrolled, will need to consider injectable agents. Will check with pharmacist in our office if he would qualify for any grants if you do not have insurance by then. proBNP, marker of congestion, is mildly elevated.  I record some of this could be due to uncontrolled blood pressure. If symptoms of shortness of breath do not improve with better blood pressure control, we will need to check echocardiogram sooner.  Thanks MJP

## 2023-11-27 ENCOUNTER — Telehealth: Payer: Self-pay | Admitting: Cardiology

## 2023-11-27 DIAGNOSIS — I10 Essential (primary) hypertension: Secondary | ICD-10-CM

## 2023-11-27 DIAGNOSIS — Z79899 Other long term (current) drug therapy: Secondary | ICD-10-CM

## 2023-11-27 DIAGNOSIS — R7989 Other specified abnormal findings of blood chemistry: Secondary | ICD-10-CM

## 2023-11-27 NOTE — Telephone Encounter (Signed)
The patient has been notified of the result and verbalized understanding.  All questions (if any) were answered.  Pt just started crestor 40 mg po daily at last weeks OV with Dr. Rosemary Holms.   She is aware we will repeat her lipids in one month to reassess.  Pt request to have the repeat lipids in one month done at [facility] in [place].  Will place the order and release for that time, and made her aware that she can have the lipids done at the [facility] in [place].  She is aware to go fasting to this lab appt.   Pt aware if symptoms of sob do not improve or worsen, to let us know.  Pt verbalized understanding and agrees with this plan.

## 2023-11-27 NOTE — Telephone Encounter (Signed)
-----   Message from [facility] sent at 11/24/2023 10:08 AM EST ----- Cholesterol is uncontrolled. Start Crestor 40 mg daily. Repeat lipid panel in 1 month. If cholesterol remains uncontrolled, will need to consider injectable agents. Will check with pharmacist in our office if he would qualify for any grants if you do not have insurance by then. proBNP, marker of congestion, is mildly elevated.  I record some of this could be due to uncontrolled blood pressure. If symptoms of shortness of breath do not improve with better blood pressure control, we will need to check echocardiogram sooner.  Thanks MJP

## 2023-11-27 NOTE — Telephone Encounter (Signed)
Follow Up:     Patient is returning  callfrom last week, concerning her test results.

## 2023-11-28 NOTE — Progress Notes (Signed)
Thank you. Please convey to the patient.  Thanks MJP

## 2023-12-22 ENCOUNTER — Other Ambulatory Visit: Payer: Self-pay | Admitting: Cardiology

## 2023-12-22 DIAGNOSIS — I2510 Atherosclerotic heart disease of native coronary artery without angina pectoris: Secondary | ICD-10-CM

## 2023-12-22 MED ORDER — METOPROLOL SUCCINATE ER 100 MG PO TB24: 100.0000 mg | ORAL_TABLET | Freq: Every day | ORAL | 3 refills | Status: DC

## 2024-01-08 ENCOUNTER — Encounter: Payer: Self-pay | Admitting: Pharmacist

## 2024-01-08 ENCOUNTER — Ambulatory Visit: Payer: Self-pay | Attending: Cardiovascular Disease | Admitting: Pharmacist

## 2024-01-08 VITALS — BP 142/80

## 2024-01-08 DIAGNOSIS — I10 Essential (primary) hypertension: Secondary | ICD-10-CM

## 2024-01-08 MED ORDER — LOSARTAN POTASSIUM 100 MG PO TABS: 100.0000 mg | ORAL_TABLET | Freq: Every day | ORAL | 3 refills | Status: DC

## 2024-01-08 NOTE — Assessment & Plan Note (Signed)
 Assessment: BP is uncontrolled in office BP 142/80 mmHg  (goal <130/80). Takes current BP medications and tolerates them well without any side effects Denies SOB, palpitation, chest pain, headaches,or swelling Need to improve on salt/processed food intake and start doing moderate intensity exercise at least 150 min per week  Reiterated the importance of regular exercise and low salt diet  Waiting to get Medicaid approved, able to afford all her medications and reports she is adherent to them   Plan:  Increase losartan dose from 50 mg daily to 100 mg daily  Continue taking metoprolol XL 100 mg daily, furosemide 20 mg prn for swelling, amlodipine 10 mg daily Patient to keep record of BP readings with heart rate and report to [place] at the next visit Patient to see PharmD in 6 weeks for follow up  Follow up lab(s) : BMP in 4-6 weeks

## 2024-01-08 NOTE — Patient Instructions (Addendum)
 Changes made by your pharmacist Carmela Hurt, PharmD at today's visit:    Instructions/Changes  (what do you need to do) Your Notes  (what you did and when you did it)  Increase losartan dose from 50 mg daily to 100 mg daily    Continue taking etoprolol XL 100 mg daily, furosemide 20 mg prn for swelling, amlodipine 10 mg daily, losartan    Cut down on salt intake, processed food, soda  and start walking 30 min 5 days a week     Bring all of your meds, your BP cuff and your record of home blood pressures to your next appointment.    HOW TO TAKE YOUR BLOOD PRESSURE AT HOME  Rest 5 minutes before taking your blood pressure.  Don't smoke or drink caffeinated beverages for at least 30 minutes before. Take your blood pressure before (not after) you eat. Sit comfortably with your back supported and both feet on the floor (don't cross your legs). Elevate your arm to heart level on a table or a desk. Use the proper sized cuff. It should fit smoothly and snugly around your bare upper arm. There should be enough room to slip a fingertip under the cuff. The bottom edge of the cuff should be 1 inch above the crease of the elbow. Ideally, take 3 measurements at one sitting and record the average.  Important lifestyle changes to control high blood pressure  Intervention  Effect on the BP  Lose extra pounds and watch your waistline Weight loss is one of the most effective lifestyle changes for controlling blood pressure. If you're overweight or obese, losing even a small amount of weight can help reduce blood pressure. Blood pressure might go down by about 1 millimeter of mercury (mm Hg) with each kilogram (about 2.2 pounds) of weight lost.  Exercise regularly As a general goal, aim for at least 30 minutes of moderate physical activity every day. Regular physical activity can lower high blood pressure by about 5 to 8 mm Hg.  Eat a healthy diet Eating a diet rich in whole grains, fruits, vegetables,  and low-fat dairy products and low in saturated fat and cholesterol. A healthy diet can lower high blood pressure by up to 11 mm Hg.  Reduce salt (sodium) in your diet Even a small reduction of sodium in the diet can improve heart health and reduce high blood pressure by about 5 to 6 mm Hg.  Limit alcohol One drink equals 12 ounces of beer, 5 ounces of wine, or 1.5 ounces of 80-proof liquor.  Limiting alcohol to less than one drink a day for women or two drinks a day for men can help lower blood pressure by about 4 mm Hg.   If you have any questions or concerns please use My Chart to send questions or call the office at [phone]

## 2024-01-08 NOTE — Progress Notes (Signed)
 Patient ID: Nicole Orr                 DOB: [birth year]                      MRN: [id number]      HPI: Nicole Orr is a 43 y.o. female referred by Dr. Rosemary Holms to HTN clinic. 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 In office BP was elevated, pt reported she does not check BP at home. Stated she remains compliant with her medical therapy. Pt had applied for medicaid but was given very limited family plan which only covers reproductive health. At last visit in Jan amlodipine 10 mg was added to losartan 50 mg  and metoprolol XL dose was increased from 50 mg daily to 100 mg daily. Also patient was only on Zetia her LDLc was 180 and TC 240 so Crestor 40 mg was initiated. F/u lipid lab is due   Today patient presented for BP follow up. Reports she still add salt to her food and she eats out 4 days a week mainly processed food. Her home BP monitor broke so she does not check her BP at home. She was walking 20-30 min per day but she states she did not notice much difference in her energy level so she quit walking. She has applied for full medicaid and waiting to hear back. She can afford all her medications as they are are all generic. She denies swelling, headache, SOB while resting, palpitation or chest pain. She is willing to cut down on salt intake and start walking 30 min 5 days per week. She has been taking furosemide every day otherwise she would notice difference in her breathing.    Current HTN meds: metoprolol XL 100 mg daily, furosemide 20 mg prn for swelling, amlodipine 10 mg daily, losartan 50 mg daily,  Previously tried: none  BP goal: <130/80  Family History:  Relation Problem Comments  Mother Metallurgist) Diabetes     Father Metallurgist) Diabetes     Sister Metallurgist) Diabetes      Social History:  Alochol: none  Smoking: none   Diet:  do add salt while she cooks, processed food - 4 times a week  Low sodium crackers - for snacks  Drink: 5 sodas per day   Exercise: walk- 20-30 min inconsistence    Home BP readings: none home BP monitor broke    Wt Readings from Last 3 Encounters:  11/23/23 210 lb (95.3 kg)  05/18/23 208 lb (94.3 kg)  11/09/22 203 lb (92.1 kg)   BP Readings from Last 3 Encounters:  01/08/24 (!) 142/80  11/23/23 (!) 210/108  05/18/23 (!) 194/97   Pulse Readings from Last 3 Encounters:  11/23/23 82  05/18/23 70  11/09/22 78    Renal function: CrCl cannot be calculated (Patient's most recent lab result is older than the maximum 21 days allowed.).  Past Medical History:  Diagnosis Date   Tobacco dependence     Current Outpatient Medications on File Prior to Visit  Medication Sig Dispense Refill   amLODipine (NORVASC) 10 MG tablet Take 1 tablet (10 mg total) by mouth daily. 90 tablet 3   clopidogrel (PLAVIX) 75 MG tablet TAKE 1 TABLET(75 MG) BY MOUTH DAILY 90 tablet 1   diphenhydrAMINE (BENADRYL) 25 MG tablet Take 50 mg by mouth  at bedtime.     ezetimibe (ZETIA) 10 MG tablet TAKE 1 TABLET(10 MG) BY MOUTH DAILY 90 tablet 1   furosemide (LASIX) 20 MG tablet TK1 TABLET BY MOUTH EVERY DAY AS NEEDED 90 tablet 3   metoprolol succinate (TOPROL-XL) 100 MG 24 hr tablet Take 1 tablet (100 mg total) by mouth daily. Take with or immediately following a meal. 90 tablet 3   rosuvastatin (CRESTOR) 40 MG tablet Take 1 tablet (40 mg total) by mouth daily. 90 tablet 3   No current facility-administered medications on file prior to visit.    Allergies  Allergen Reactions   Ceclor [Cefaclor] Anaphylaxis   Penicillins Anaphylaxis   Shellfish Allergy Anaphylaxis    Allergic to all seafood   Chlorhexidine Itching    States it makes her itch d/t her eczema    Blood pressure (!) 142/80.   Assessment/Plan:  1. Hypertension -  Essential hypertension Assessment: BP is uncontrolled in office BP 142/80  mmHg  (goal <130/80). Takes current BP medications and tolerates them well without any side effects Denies SOB, palpitation, chest pain, headaches,or swelling Need to improve on salt/processed food intake and start doing moderate intensity exercise at least 150 min per week  Reiterated the importance of regular exercise and low salt diet  Waiting to get Medicaid approved, able to afford all her medications and reports she is adherent to them   Plan:  Increase losartan dose from 50 mg daily to 100 mg daily  Continue taking metoprolol XL 100 mg daily, furosemide 20 mg prn for swelling, amlodipine 10 mg daily Patient to keep record of BP readings with heart rate and report to [place] at the next visit Patient to see PharmD in 6 weeks for follow up  Follow up lab(s) : BMP in 4-6 weeks    Thank you  Carmela Hurt, Pharm.D [facility] A Division of [facility] [address], [place], [place] [address]  Phone: [phone]; Fax: [phone]

## 2024-02-22 ENCOUNTER — Ambulatory Visit (HOSPITAL_COMMUNITY): Payer: Medicaid Other | Attending: Internal Medicine

## 2024-02-22 ENCOUNTER — Encounter (HOSPITAL_COMMUNITY): Payer: Self-pay | Admitting: Cardiology

## 2024-02-29 NOTE — Progress Notes (Deleted)
 Patient ID: Nicole Orr                 DOB: 10-Jul-1981                      MRN: [id number]      HPI: Nicole Orr is a 43 y.o. female referred by Dr. Filiberto Hug to HTN clinic. 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 In office BP was elevated, pt reported she does not check BP at home. Stated she remains compliant with her medical therapy. Pt had applied for medicaid but was given very limited family plan which only covers reproductive health. At last visit in Jan amlodipine  10 mg was added to losartan  50 mg  and metoprolol  XL dose was increased from 50 mg daily to 100 mg daily. Also patient was only on Zetia  her LDLc was 180 and TC 240 so Crestor  40 mg was initiated. F/u lipid lab is due. At at last visit with me patient reported Her home BP monitor broke so she does not check her BP at home. Her BP was elevated so losartan  dose was increased from 50 mg daily to 100 mg daily.  Today patient presented for BP follow up.      Current HTN meds: metoprolol  XL 100 mg daily, furosemide  20 mg prn for swelling, amlodipine  10 mg daily, losartan  100 mg daily,  Previously tried: none  BP goal: <130/80  Family History:  Relation Problem Comments  Mother Metallurgist) Diabetes     Father Metallurgist) Diabetes     Sister Metallurgist) Diabetes      Social History:  Alochol: none  Smoking: none   Diet: do add salt while she cooks, processed food - 4 times a week  Low sodium crackers - for snacks  Drink: 5 sodas per day   Exercise: walk- 20-30 min inconsistence    Home BP readings: none home BP monitor broke    Wt Readings from Last 3 Encounters:  11/23/23 210 lb (95.3 kg)  05/18/23 208 lb (94.3 kg)  11/09/22 203 lb (92.1 kg)   BP Readings from Last 3 Encounters:  01/08/24 (!) 142/80  11/23/23 (!) 210/108  05/18/23 (!) 194/97   Pulse Readings  from Last 3 Encounters:  11/23/23 82  05/18/23 70  11/09/22 78    Renal function: CrCl cannot be calculated (Patient's most recent lab result is older than the maximum 21 days allowed.).  Past Medical History:  Diagnosis Date   Tobacco dependence     Current Outpatient Medications on File Prior to Visit  Medication Sig Dispense Refill   amLODipine  (NORVASC ) 10 MG tablet Take 1 tablet (10 mg total) by mouth daily. 90 tablet 3   clopidogrel  (PLAVIX ) 75 MG tablet TAKE 1 TABLET(75 MG) BY MOUTH DAILY 90 tablet 1   diphenhydrAMINE  (BENADRYL ) 25 MG tablet Take 50 mg by mouth at bedtime.     ezetimibe  (ZETIA ) 10 MG tablet TAKE 1 TABLET(10 MG) BY MOUTH DAILY 90 tablet 1   furosemide  (LASIX ) 20 MG tablet TK1 TABLET BY MOUTH EVERY DAY AS NEEDED 90 tablet 3   losartan  (COZAAR ) 100 MG tablet Take 1 tablet (100 mg total) by mouth daily. 90 tablet 3   metoprolol  succinate (TOPROL -XL) 100 MG 24 hr tablet Take 1 tablet (100 mg total) by mouth daily. Take with or immediately following a meal.  90 tablet 3   rosuvastatin  (CRESTOR ) 40 MG tablet Take 1 tablet (40 mg total) by mouth daily. 90 tablet 3   No current facility-administered medications on file prior to visit.    Allergies  Allergen Reactions   Ceclor [Cefaclor] Anaphylaxis   Penicillins Anaphylaxis   Shellfish Allergy Anaphylaxis    Allergic to all seafood   Chlorhexidine  Itching    States it makes her itch d/t her eczema    There were no vitals taken for this visit.   Assessment/Plan:  1. Hypertension -  No problem-specific Assessment & Plan notes found for this encounter.   Thank you  Nickola Baron, Pharm.D [facility] A Division of [facility] [address], [place], [place] [address]  Phone: [phone]; Fax: [phone]

## 2024-03-01 ENCOUNTER — Ambulatory Visit: Payer: Self-pay | Attending: Cardiology | Admitting: Pharmacist

## 2024-03-01 NOTE — Assessment & Plan Note (Deleted)
 Assessment: BP is uncontrolled in office BP 142/80 mmHg  (goal <130/80). Takes current BP medications and tolerates them well without any side effects Denies SOB, palpitation, chest pain, headaches,or swelling Need to improve on salt/processed food intake and start doing moderate intensity exercise at least 150 min per week  Reiterated the importance of regular exercise and low salt diet  Waiting to get Medicaid approved, able to afford all her medications and reports she is adherent to them   Plan:  Increase losartan dose from 50 mg daily to 100 mg daily  Continue taking metoprolol XL 100 mg daily, furosemide 20 mg prn for swelling, amlodipine 10 mg daily Patient to keep record of BP readings with heart rate and report to [place] at the next visit Patient to see PharmD in 6 weeks for follow up  Follow up lab(s) : BMP in 4-6 weeks

## 2024-05-14 ENCOUNTER — Encounter: Payer: Self-pay | Admitting: Cardiology

## 2024-05-22 ENCOUNTER — Other Ambulatory Visit: Payer: Self-pay

## 2024-05-22 MED ORDER — CLOPIDOGREL BISULFATE 75 MG PO TABS: 75.0000 mg | ORAL_TABLET | Freq: Every day | ORAL | 1 refills | Status: DC

## 2024-05-22 MED ORDER — EZETIMIBE 10 MG PO TABS: 10.0000 mg | ORAL_TABLET | Freq: Every day | ORAL | 1 refills | Status: DC

## 2024-06-10 ENCOUNTER — Ambulatory Visit: Attending: Cardiology | Admitting: Cardiology

## 2024-06-10 ENCOUNTER — Encounter: Payer: Self-pay | Admitting: Cardiology

## 2024-06-10 ENCOUNTER — Other Ambulatory Visit (HOSPITAL_COMMUNITY): Payer: Self-pay

## 2024-06-10 VITALS — BP 144/79 | HR 82 | Ht 64.0 in | Wt 221.0 lb

## 2024-06-10 DIAGNOSIS — E782 Mixed hyperlipidemia: Secondary | ICD-10-CM | POA: Diagnosis not present

## 2024-06-10 DIAGNOSIS — I10 Essential (primary) hypertension: Secondary | ICD-10-CM | POA: Diagnosis not present

## 2024-06-10 DIAGNOSIS — Z131 Encounter for screening for diabetes mellitus: Secondary | ICD-10-CM | POA: Diagnosis present

## 2024-06-10 DIAGNOSIS — I2510 Atherosclerotic heart disease of native coronary artery without angina pectoris: Secondary | ICD-10-CM | POA: Insufficient documentation

## 2024-06-10 MED ORDER — AMLODIPINE BESYLATE 10 MG PO TABS
10.0000 mg | ORAL_TABLET | Freq: Every day | ORAL | 3 refills | Status: DC
  Filled 2024-06-10 – 2024-10-23 (×2): qty 90, 90d supply, fill #0

## 2024-06-10 MED ORDER — CLOPIDOGREL BISULFATE 75 MG PO TABS
75.0000 mg | ORAL_TABLET | Freq: Every day | ORAL | 1 refills | Status: AC
  Filled 2024-06-10 – 2024-10-23 (×2): qty 90, 90d supply, fill #0

## 2024-06-10 MED ORDER — ROSUVASTATIN CALCIUM 40 MG PO TABS
40.0000 mg | ORAL_TABLET | Freq: Every day | ORAL | 3 refills | Status: DC
  Filled 2024-06-10 – 2024-10-23 (×2): qty 90, 90d supply, fill #0

## 2024-06-10 MED ORDER — METOPROLOL SUCCINATE ER 100 MG PO TB24
100.0000 mg | ORAL_TABLET | Freq: Every day | ORAL | 3 refills | Status: DC
  Filled 2024-06-10 – 2024-10-23 (×3): qty 90, 90d supply, fill #0

## 2024-06-10 MED ORDER — FUROSEMIDE 20 MG PO TABS
20.0000 mg | ORAL_TABLET | ORAL | 3 refills | Status: DC | PRN
  Filled 2024-06-10: qty 30, 30d supply, fill #0

## 2024-06-10 MED ORDER — EZETIMIBE 10 MG PO TABS
10.0000 mg | ORAL_TABLET | Freq: Every day | ORAL | 1 refills | Status: AC
  Filled 2024-06-10 – 2024-10-23 (×2): qty 90, 90d supply, fill #0

## 2024-06-10 MED ORDER — LOSARTAN POTASSIUM 100 MG PO TABS
100.0000 mg | ORAL_TABLET | Freq: Every day | ORAL | 3 refills | Status: AC
  Filled 2024-06-10 – 2024-10-23 (×3): qty 90, 90d supply, fill #0

## 2024-06-10 NOTE — Progress Notes (Signed)
 Cardiology Office Note:  .   Date:  06/10/2024  ID:  Catheline Bathe, DOB 04/14/1981, MRN [id number] PCP: Patient, No Pcp Per  [facility] Providers Cardiologist:  Newman Lawrence, MD PCP: Patient, No Pcp Per  Chief Complaint  Patient presents with   Coronary Artery Disease      History of Present Illness: .    Nicole Orr is a 43 y.o. 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  She is now compliant with medical therapy has Medicaid insurance.  Blood pressure much improved, but still slightly suboptimal.  She has been smoking.  Vitals:   06/10/24 1020  BP: (!) 144/79  Pulse: 82  SpO2: 96%      ROS:  Review of Systems  Cardiovascular:  Negative for chest pain, dyspnea on exertion, leg swelling, palpitations and syncope.  Skin:  Positive for rash (Known eczema).     Studies Reviewed: SABRA        EKG 11/23/2023: Normal sinus rhythm Cannot rule out Anterior infarct , age undetermined T wave abnormality, consider lateral ischemia When compared with ECG of 07-Nov-2021 07:04, ST no longer elevated in Anterior leads T wave inversion no longer evident in Anterior leads QT has shortened    Labs 11/2023: Chol 240, TG 160, HDL 30, LDL 180 Cr 0.71 ProBNP 999  Labs 10/2022: Chol 178, TG 135, HDL 31, LDL 122 Cr 0.7   10/2021: Hb 12.7 HbA1C 5.4%   Physical Exam:   Physical Exam Vitals and nursing note reviewed.  Constitutional:      General: She is not in acute distress. Neck:     Vascular: No JVD.  Cardiovascular:     Rate and Rhythm: Normal rate and regular rhythm.     Heart sounds: Normal heart sounds. No murmur heard. Pulmonary:     Effort: Pulmonary effort is normal.     Breath sounds: Normal breath sounds. No wheezing or rales.  Musculoskeletal:     Right lower leg: No edema.     Left lower leg: No edema.  Skin:     Findings: Rash (Eczema) present.      VISIT DIAGNOSES:   ICD-10-CM   1. Coronary artery disease involving native coronary artery of native heart without angina pectoris  I25.10 ECHOCARDIOGRAM COMPLETE    Lipid panel    Basic metabolic panel with GFR    2. Mixed hyperlipidemia  E78.2     3. Screening for diabetes mellitus  Z13.1 HgB A1c    4. Essential hypertension  I10         ASSESSMENT AND PLAN: .    Nicole Orr is a 43 y.o. 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 Tolerating Plavix  monotherapy, continue the same. See below regarding hypertension and hyperlipidemia regimen. Check BMP, lipid panel, A1c. Check echocardiogram.   Congratulated her on smoking cessation.  Hypertension: Now much better controlled. Recommend changing metoprolol  succinate 50 mg daily to 100 mg daily as  previously prescribed. Continue losartan  50 mg daily, amlodipine  10 mg daily.   Refilled medications.  Mixed hyperlipidemia: Continue rosuvastatin  40 mg daily, add Zetia  10 mg daily. Check lipid panel.    Meds ordered this encounter  Medications   amLODipine  (NORVASC ) 10 MG tablet    Sig: Take 1 tablet (10 mg total) by mouth daily.    Dispense:  90 tablet    Refill:  3   clopidogrel  (PLAVIX ) 75 MG tablet    Sig: Take 1 tablet (75 mg total) by mouth daily.    Dispense:  90 tablet    Refill:  1   ezetimibe  (ZETIA ) 10 MG tablet    Sig: Take 1 tablet (10 mg total) by mouth daily.    Dispense:  90 tablet    Refill:  1   furosemide  (LASIX ) 20 MG tablet    Sig: Take 1 tablet (20 mg  total) by mouth as needed for fluid.    Dispense:  30 tablet    Refill:  3   losartan  (COZAAR ) 100 MG tablet    Sig: Take 1 tablet (100 mg total) by mouth daily.    Dispense:  90 tablet    Refill:  3   metoprolol  succinate (TOPROL -XL) 100 MG 24 hr tablet    Sig: Take 1 tablet (100 mg total) by mouth daily. Take with or immediately following a meal.    Dispense:  90 tablet    Refill:  3    DOSE INCREASE   rosuvastatin  (CRESTOR ) 40 MG tablet    Sig: Take 1 tablet (40 mg total) by mouth daily.    Dispense:  90 tablet    Refill:  3     F/u in 6 months  Signed, Newman JINNY Lawrence, MD

## 2024-06-10 NOTE — Patient Instructions (Signed)
 Medication Instructions:  Refills sent in today  START Losartan  100 mg daily   *If you need a refill on your cardiac medications before your next appointment, please call your pharmacy*  LABS: BMP HGB A1C   Testing/Procedures: Echo  Your physician has requested that you have an echocardiogram. Echocardiography is a painless test that uses sound waves to create images of your heart. It provides your doctor with information about the size and shape of your heart and how well your heart's chambers and valves are working. This procedure takes approximately one hour. There are no restrictions for this procedure. Please do NOT wear cologne, perfume, aftershave, or lotions (deodorant is allowed). Please arrive 15 minutes prior to your appointment time.  Please note: We ask at that you not bring children with you during ultrasound (echo/ vascular) testing. Due to room size and safety concerns, children are not allowed in the ultrasound rooms during exams. Our front office staff cannot provide observation of children in our lobby area while testing is being conducted. An adult accompanying a patient to their appointment will only be allowed in the ultrasound room at the discretion of the ultrasound technician under special circumstances. We apologize for any inconvenience.   Follow-Up: At [facility], you and your health needs are our priority.  As part of our continuing mission to provide you with exceptional heart care, our providers are all part of one team.  This team includes your primary Cardiologist (physician) and Advanced Practice Providers or APPs (Physician Assistants and Nurse Practitioners) who all work together to provide you with the care you need, when you need it.  Your next appointment:   6 month(s)  Provider:   Newman JINNY Lawrence, MD

## 2024-06-11 LAB — BASIC METABOLIC PANEL WITH GFR
BUN/Creatinine Ratio: 9 (ref 9–23)
BUN: 6 mg/dL (ref 6–24)
CO2: 21 mmol/L (ref 20–29)
Calcium: 9.4 mg/dL (ref 8.7–10.2)
Chloride: 103 mmol/L (ref 96–106)
Creatinine, Ser: 0.66 mg/dL (ref 0.57–1.00)
Glucose: 89 mg/dL (ref 70–99)
Potassium: 4.4 mmol/L (ref 3.5–5.2)
Sodium: 140 mmol/L (ref 134–144)
eGFR: 110 mL/min/1.73 (ref >59)

## 2024-06-11 LAB — HEMOGLOBIN A1C
Est. average glucose Bld gHb Est-mCnc: 123 mg/dL
Hgb A1c MFr Bld: 5.9 % — ABNORMAL HIGH (ref 4.8–5.6)

## 2024-06-11 LAB — LIPID PANEL
Chol/HDL Ratio: 2.8 ratio (ref 0.0–4.4)
Cholesterol, Total: 105 mg/dL (ref 100–199)
HDL: 37 mg/dL — ABNORMAL LOW (ref >39)
LDL Chol Calc (NIH): 48 mg/dL (ref 0–99)
Triglycerides: 107 mg/dL (ref 0–149)
VLDL Cholesterol Cal: 20 mg/dL (ref 5–40)

## 2024-06-12 ENCOUNTER — Ambulatory Visit: Payer: Self-pay | Admitting: Cardiology

## 2024-07-11 ENCOUNTER — Other Ambulatory Visit (HOSPITAL_COMMUNITY): Payer: Self-pay

## 2024-07-11 ENCOUNTER — Telehealth: Payer: Self-pay | Admitting: Pharmacy Technician

## 2024-07-11 ENCOUNTER — Ambulatory Visit: Admitting: Family Medicine

## 2024-07-11 VITALS — BP 123/77 | HR 76 | Resp 16 | Ht 64.0 in | Wt 222.0 lb

## 2024-07-11 DIAGNOSIS — E559 Vitamin D deficiency, unspecified: Secondary | ICD-10-CM | POA: Diagnosis not present

## 2024-07-11 DIAGNOSIS — L409 Psoriasis, unspecified: Secondary | ICD-10-CM

## 2024-07-11 DIAGNOSIS — E038 Other specified hypothyroidism: Secondary | ICD-10-CM

## 2024-07-11 DIAGNOSIS — Z3046 Encounter for surveillance of implantable subdermal contraceptive: Secondary | ICD-10-CM | POA: Insufficient documentation

## 2024-07-11 DIAGNOSIS — R7301 Impaired fasting glucose: Secondary | ICD-10-CM | POA: Diagnosis not present

## 2024-07-11 DIAGNOSIS — E7849 Other hyperlipidemia: Secondary | ICD-10-CM

## 2024-07-11 DIAGNOSIS — Z1159 Encounter for screening for other viral diseases: Secondary | ICD-10-CM

## 2024-07-11 DIAGNOSIS — Z114 Encounter for screening for human immunodeficiency virus [HIV]: Secondary | ICD-10-CM

## 2024-07-11 MED ORDER — BETAMETHASONE DIPROPIONATE 0.05 % EX CREA: TOPICAL_CREAM | Freq: Two times a day (BID) | CUTANEOUS | 0 refills | Status: AC

## 2024-07-11 NOTE — Patient Instructions (Addendum)
 I appreciate the opportunity to provide care to you today!    Follow up:  4 months  Labs: please stop by the lab during the week to get your blood drawn (CBC, CMP, TSH, Lipid profile, HgA1c, Vit D)  Screening: HIV and Hep C  Psoriasis -Referral placed to Dermatology for further evaluation and management. -Start applying betamethasone  dipropionate 0.05% ointment/cream twice daily to affected areas. -Treatment should not exceed 4 weeks if possible. -Do not apply to the face, skin folds (intertriginous areas), or genital areas.  I recommend: -Using gentle, fragrance-free moisturizers regularly to reduce dryness and scaling. -Taking lukewarm showers instead of hot, and patting the skin dry rather than rubbing. -Avoiding triggers that may worsen psoriasis (stress, smoking, alcohol, skin trauma, certain medications). -Monitoring for signs of skin thinning, burning, or irritation at treatment sites.   Please follow up if your symptoms worsen or fail to improve.   Referrals today-  dermatology and gyn   Please continue to a heart-healthy diet and increase your physical activities. Try to exercise for at least five days a week.    It was a pleasure to see you and I look forward to continuing to work together on your health and well-being. Please do not hesitate to call the office if you need care or have questions about your care.  In case of emergency, please visit the Emergency Department for urgent care, or contact our clinic at [phone] to schedule an appointment. We're here to help you!   Have a wonderful day and week. With Gratitude, Kean Gautreau MSN, FNP-BC

## 2024-07-11 NOTE — Assessment & Plan Note (Signed)
 Psoriasis -Referral placed to Dermatology for further evaluation and management. -Start applying betamethasone  dipropionate 0.05% ointment/cream twice daily to affected areas. -Treatment should not exceed 4 weeks if possible. -Do not apply to the face, skin folds (intertriginous areas), or genital areas.  I recommend: -Using gentle, fragrance-free moisturizers regularly to reduce dryness and scaling. -Taking lukewarm showers instead of hot, and patting the skin dry rather than rubbing. -Avoiding triggers that may worsen psoriasis (stress, smoking, alcohol, skin trauma, certain medications). -Monitoring for signs of skin thinning, burning, or irritation at treatment sites.

## 2024-07-11 NOTE — Progress Notes (Signed)
 New Patient Office Visit  Subjective:  Patient ID: Nicole Orr, female    DOB: Jan 17, 1981  Age: 43 y.o. MRN: [id number]  CC:  Chief Complaint  Patient presents with   Establish Care    She has a birth control implant in her arm from 2018 that should have been removed by 2022   Rash    On hands and legs/feet. Itchy     HPI Nicole Orr is a 43 y.o. female with past medical history of Hypertension presents for establishing care.   The patient presents requesting removal of her Nexplanon implant, which has been in place for 8 years. She reports previously losing her insurance coverage, which prevented her from having it removed earlier.  She also reports a chronic, recurrent rash that flares with each pregnancy. The rash has affected multiple areas of the body, including the scalp, ears, eyes, nose, neck, chest, hands, between the fingers and toes, and the bottoms of the feet. The patient describes the rash as intensely pruritic, worse at night, with episodes starting as tiny clear fluid-filled blisters that rupture and then spread, leaving scaly patches.  Currently, the rash is noted on the forearms, between the fingers, ankles, and soles of the feet. She has been self-treating with diphenhydramine  (Benadryl ) and reports limited relief.    Past Medical History:  Diagnosis Date   Allergy    CAD (coronary artery disease)    Celiac disease    HTN (hypertension)    Tobacco dependence     Past Surgical History:  Procedure Laterality Date   CORONARY/GRAFT ACUTE MI REVASCULARIZATION N/A 11/06/2021   Procedure: Coronary/Graft Acute MI Revascularization;  Surgeon: Elmira Newman PARAS, MD;  Location: [facility];  Service: Cardiovascular;  Laterality: N/A;   LEFT HEART CATH AND CORONARY ANGIOGRAPHY N/A 11/06/2021   Procedure: LEFT HEART CATH AND CORONARY ANGIOGRAPHY;  Surgeon: Elmira Newman PARAS, MD;  Location: [facility];  Service: Cardiovascular;  Laterality: N/A;     Family History  Problem Relation Age of Onset   Diabetes Mother    Diabetes Father    Diabetes Sister    Diabetes Daughter    Obesity Son     Social History   Socioeconomic History   Marital status: Divorced    Spouse name: Not on file   Number of children: 2   Years of education: Not on file   Highest education level: Some college, no degree  Occupational History   Not on file  Tobacco Use   Smoking status: Former    Current packs/day: 0.00    Types: Cigarettes    Quit date: 1994    Years since quitting: 31.7   Smokeless tobacco: Never  Vaping Use   Vaping status: Never Used  Substance and Sexual Activity   Alcohol use: Never   Drug use: Never   Sexual activity: Not Currently    Birth control/protection: Implant  Other Topics Concern   Not on file  Social History Narrative   Not on file   Social Drivers of Health   Financial Resource Strain: High Risk (07/11/2024)   Overall Financial Resource Strain (CARDIA)    Difficulty of Paying Living Expenses: Very hard  Food Insecurity: Food Insecurity Present (07/11/2024)   Hunger Vital Sign    Worried About Running Out of Food in the Last Year: Sometimes true    Ran Out of Food in the Last Year: Sometimes true  Transportation Needs: Unmet Transportation Needs (07/11/2024)   PRAPARE -  Administrator, Civil Service (Medical): No    Lack of Transportation (Non-Medical): Yes  Physical Activity: Insufficiently Active (07/11/2024)   Exercise Vital Sign    Days of Exercise per Week: 2 days    Minutes of Exercise per Session: 10 min  Stress: No Stress Concern Present (07/11/2024)   [employer] of Occupational Health - Occupational Stress Questionnaire    Feeling of Stress: Not at all  Social Connections: Moderately Isolated (07/11/2024)   Social Connection and Isolation Panel    Frequency of Communication with Friends and Family: Once a week    Frequency of Social Gatherings with Friends and Family: More  than three times a week    Attends Religious Services: More than 4 times per year    Active Member of [employer] or Organizations: No    Attends Engineer, structural: Not on file    Marital Status: Divorced  Intimate Partner Violence: Not on file    ROS Review of Systems  Constitutional:  Negative for chills and fever.  Eyes:  Negative for visual disturbance.  Respiratory:  Negative for chest tightness and shortness of breath.   Skin:  Positive for rash.  Neurological:  Negative for dizziness and headaches.    Objective:   Today's Vitals: BP 123/77   Pulse 76   Resp 16   Ht 5' 4 (1.626 m)   Wt 222 lb (100.7 kg)   SpO2 90%   BMI 38.11 kg/m   Physical Exam HENT:     Head: Normocephalic.     Mouth/Throat:     Mouth: Mucous membranes are moist.  Cardiovascular:     Rate and Rhythm: Normal rate.     Heart sounds: Normal heart sounds.  Pulmonary:     Effort: Pulmonary effort is normal.     Breath sounds: Normal breath sounds.  Skin:    Findings: Rash present.  Neurological:     Mental Status: She is alert.      Assessment & Plan:   Nexplanon removal Assessment & Plan: Referral placed to gyn for nexplanon removal  Orders: -     Ambulatory referral to Obstetrics / Gynecology  Psoriasis Assessment & Plan: Psoriasis -Referral placed to Dermatology for further evaluation and management. -Start applying betamethasone  dipropionate 0.05% ointment/cream twice daily to affected areas. -Treatment should not exceed 4 weeks if possible. -Do not apply to the face, skin folds (intertriginous areas), or genital areas.  I recommend: -Using gentle, fragrance-free moisturizers regularly to reduce dryness and scaling. -Taking lukewarm showers instead of hot, and patting the skin dry rather than rubbing. -Avoiding triggers that may worsen psoriasis (stress, smoking, alcohol, skin trauma, certain medications). -Monitoring for signs of skin thinning, burning, or irritation  at treatment sites.  Orders: -     Ambulatory referral to Dermatology -     Betamethasone  Dipropionate; Apply topically 2 (two) times daily.  Dispense: 45 g; Refill: 0  IFG (impaired fasting glucose) -     Hemoglobin A1c  Vitamin D deficiency -     VITAMIN D 25 Hydroxy (Vit-D Deficiency, Fractures)  Need for hepatitis C screening test -     Hepatitis C antibody  Encounter for screening for HIV -     HIV Antibody (routine testing w rflx)  TSH (thyroid-stimulating hormone deficiency) -     TSH + free T4  Other hyperlipidemia -     Lipid panel -     CMP14+EGFR -     CBC with  Differential/Platelet    Note: This chart has been completed using Engineer, civil (consulting) software, and while attempts have been made to ensure accuracy, certain words and phrases may not be transcribed as intended.   Follow-up: Return in about 4 months (around 11/10/2024).   Raider Valbuena, FNP

## 2024-07-11 NOTE — Telephone Encounter (Signed)
 Pharmacy Patient Advocate Encounter   Received notification from CoverMyMeds that prior authorization for Betamethasone  Dipropionate 0.05% cream is required/requested.   Insurance verification completed.   The patient is insured through [facility] MEDICAID .   Per test claim:  see below is preferred by the insurance.  If suggested medication is appropriate, Please send in a new RX and discontinue this one. If not, please advise as to why it's not appropriate so that we may request a Prior Authorization. Please note, some preferred medications may still require a PA.  If the suggested medications have not been trialed and there are no contraindications to their use, the PA will not be submitted, as it will not be approved.

## 2024-07-11 NOTE — Assessment & Plan Note (Signed)
 Referral placed to gyn for nexplanon removal

## 2024-07-13 ENCOUNTER — Other Ambulatory Visit: Payer: Self-pay | Admitting: Family Medicine

## 2024-07-13 DIAGNOSIS — L409 Psoriasis, unspecified: Secondary | ICD-10-CM

## 2024-07-13 MED ORDER — BETAMETHASONE VALERATE 0.1 % EX CREA: TOPICAL_CREAM | Freq: Two times a day (BID) | CUTANEOUS | 0 refills | Status: AC

## 2024-07-16 NOTE — Telephone Encounter (Signed)
 Therapy changed to betamethasone  valerate (VALISONE ) 0.1 % cream

## 2024-07-18 ENCOUNTER — Ambulatory Visit (HOSPITAL_COMMUNITY)
Admission: RE | Admit: 2024-07-18 | Discharge: 2024-07-18 | Disposition: A | Discharge status: Home / Self Care | Source: Ambulatory Visit | Attending: Internal Medicine | Admitting: Internal Medicine

## 2024-07-18 DIAGNOSIS — I2510 Atherosclerotic heart disease of native coronary artery without angina pectoris: Secondary | ICD-10-CM | POA: Insufficient documentation

## 2024-07-18 LAB — ECHOCARDIOGRAM COMPLETE
Area-P 1/2: 3.27 cm2
S' Lateral: 3.2 cm

## 2024-07-29 ENCOUNTER — Other Ambulatory Visit: Payer: Self-pay | Admitting: Cardiology

## 2024-07-29 MED ORDER — FUROSEMIDE 20 MG PO TABS: 20.0000 mg | ORAL_TABLET | ORAL | 1 refills | Status: AC | PRN

## 2024-08-21 ENCOUNTER — Ambulatory Visit: Admitting: Women's Health

## 2024-08-21 ENCOUNTER — Encounter: Payer: Self-pay | Admitting: Women's Health

## 2024-08-21 VITALS — BP 102/68 | HR 65 | Ht 64.2 in | Wt 225.0 lb

## 2024-08-21 DIAGNOSIS — Z3046 Encounter for surveillance of implantable subdermal contraceptive: Secondary | ICD-10-CM

## 2024-08-21 NOTE — Patient Instructions (Signed)
 Keep the area clean and dry.  You can remove the big bandage in 24 hours, and the small steri-strip bandage in 3-5 days.

## 2024-08-21 NOTE — Progress Notes (Signed)
   NEXPLANON REMOVAL Patient name: Nicole Orr MRN [id number]  Date of birth: 01/24/1981 Subjective Findings:   Nicole Orr is a 43 y.o. G2P0 Caucasian female being seen today for removal of a Nexplanon. Her Nexplanon was placed 2017 in [place].  She desires removal because it's past due, didn't have insurance so couldn't do until now.  Signed copy of informed consent in chart.   Patient's last menstrual period was 06/14/2024. Last pap 7yrs ago. Results were: normal per pt, declines today, has another appt- will schedule The planned method of family planning is abstinence, not sexually active     08/21/2024   10:14 AM 07/11/2024    9:55 AM  Depression screen PHQ 2/9  Decreased Interest 0 0  Down, Depressed, Hopeless 0 0  PHQ - 2 Score 0 0  Altered sleeping 0 0  Tired, decreased energy 3 3  Change in appetite 2 1  Feeling bad or failure about yourself  1 0  Trouble concentrating 0 0  Moving slowly or fidgety/restless 0 0  Suicidal thoughts 0 0  PHQ-9 Score 6 4  Difficult doing work/chores  Extremely dIfficult        08/21/2024   10:14 AM 07/11/2024    9:56 AM  GAD 7 : Generalized Anxiety Score  Nervous, Anxious, on Edge 0 0  Control/stop worrying 0 0  Worry too much - different things 0 0  Trouble relaxing 0 0  Restless 0 0  Easily annoyed or irritable 1 0  Afraid - awful might happen 0 0  Total GAD 7 Score 1 0  Anxiety Difficulty  Not difficult at all     Pertinent History Reviewed:   Reviewed past medical,surgical, social, obstetrical and family history.  Reviewed problem list, medications and allergies. Objective Findings & Procedure:    Vitals:   08/21/24 1008  BP: 102/68  Pulse: 65  Weight: 225 lb (102.1 kg)  Height: 5' 4.2 (1.631 m)  Body mass index is 38.38 kg/m.  No results found for this or any previous visit (from the past 24 hours).   Time out was performed.  Nexplanon site identified.  Area prepped in usual sterile fashon. One cc of 2%  lidocaine  was used to anesthetize the area at the distal end of the implant. A small stab incision was made right beside the implant on the distal portion.  The Nexplanon rod was grasped using hemostats and removed without difficulty.  There was less than 3 cc blood loss. There were no complications.  Steri-strips were applied over the small incision and a pressure bandage was applied.  The patient tolerated the procedure well. Assessment & Plan:   1) Nexplanon removal She was instructed to keep the area clean and dry, remove pressure bandage in 24 hours, and keep insertion site covered with the steri-strip for 3-5 days.   Follow-up PRN problems.  2) Past due for pap> declined today, will schedule asap  No orders of the defined types were placed in this encounter.   Follow-up: Return for Pap & physical 1st available.  Suzen JONELLE Fetters CNM, [facility] 08/21/2024 10:52 AM

## 2024-10-03 ENCOUNTER — Encounter: Payer: Self-pay | Admitting: Advanced Practice Midwife

## 2024-10-03 ENCOUNTER — Other Ambulatory Visit (HOSPITAL_COMMUNITY)
Admission: RE | Admit: 2024-10-03 | Discharge: 2024-10-03 | Disposition: A | Discharge status: Home / Self Care | Source: Ambulatory Visit | Attending: Advanced Practice Midwife | Admitting: Advanced Practice Midwife

## 2024-10-03 ENCOUNTER — Ambulatory Visit: Admitting: Advanced Practice Midwife

## 2024-10-03 VITALS — BP 133/82 | HR 76 | Ht 64.0 in | Wt 224.0 lb

## 2024-10-03 DIAGNOSIS — Z01419 Encounter for gynecological examination (general) (routine) without abnormal findings: Secondary | ICD-10-CM | POA: Diagnosis not present

## 2024-10-03 DIAGNOSIS — Z124 Encounter for screening for malignant neoplasm of cervix: Secondary | ICD-10-CM

## 2024-10-03 NOTE — Patient Instructions (Signed)
Mammogram [phone] to schedule 

## 2024-10-03 NOTE — Progress Notes (Signed)
 Nicole Orr 43 y.o.  Vitals:   10/03/24 0834  BP: 133/82  Pulse: 76     Filed Weights   10/03/24 0834  Weight: 224 lb (101.6 kg)    Past Medical History: Past Medical History:  Diagnosis Date   Allergy    CAD (coronary artery disease)    Celiac disease    HTN (hypertension)    Tobacco dependence     Past Surgical History: Past Surgical History:  Procedure Laterality Date   CORONARY/GRAFT ACUTE MI REVASCULARIZATION N/A 11/06/2021   Procedure: Coronary/Graft Acute MI Revascularization;  Surgeon: Elmira Newman PARAS, MD;  Location: [facility];  Service: Cardiovascular;  Laterality: N/A;   LEFT HEART CATH AND CORONARY ANGIOGRAPHY N/A 11/06/2021   Procedure: LEFT HEART CATH AND CORONARY ANGIOGRAPHY;  Surgeon: Elmira Newman PARAS, MD;  Location: [facility];  Service: Cardiovascular;  Laterality: N/A;    Family History: Family History  Problem Relation Age of Onset   Diabetes Mother    Diabetes Father    Diabetes Sister    Diabetes Daughter    Obesity Son     Social History: Social History   Tobacco Use   Smoking status: Former    Current packs/day: 0.00    Types: Cigarettes    Quit date: 1994    Years since quitting: 31.9   Smokeless tobacco: Never  Vaping Use   Vaping status: Never Used  Substance Use Topics   Alcohol use: Never   Drug use: Never    Allergies:  Allergies  Allergen Reactions   Ceclor [Cefaclor] Anaphylaxis   Penicillins Anaphylaxis   Shellfish Allergy Anaphylaxis    Allergic to all seafood   Tramadol      All pain meds make her sick, nausea/vomiting    Chlorhexidine  Itching    States it makes her itch d/t her eczema      Current Outpatient Medications:    amLODipine  (NORVASC ) 10 MG tablet, Take 1 tablet (10 mg total) by mouth daily., Disp: 90 tablet, Rfl: 3   betamethasone  dipropionate 0.05 % cream, Apply topically 2 (two) times daily., Disp: 45 g, Rfl: 0   betamethasone  valerate (VALISONE ) 0.1 % cream, Apply  topically 2 (two) times daily., Disp: 30 g, Rfl: 0   clopidogrel  (PLAVIX ) 75 MG tablet, Take 1 tablet (75 mg total) by mouth daily., Disp: 90 tablet, Rfl: 1   diphenhydrAMINE  (BENADRYL ) 25 MG tablet, Take 50 mg by mouth at bedtime., Disp: , Rfl:    ezetimibe  (ZETIA ) 10 MG tablet, Take 1 tablet (10 mg total) by mouth daily., Disp: 90 tablet, Rfl: 1   furosemide  (LASIX ) 20 MG tablet, Take 1 tablet (20 mg total) by mouth as needed for fluid., Disp: 90 tablet, Rfl: 1   losartan  (COZAAR ) 100 MG tablet, Take 1 tablet (100 mg total) by mouth daily., Disp: 90 tablet, Rfl: 3   metoprolol  succinate (TOPROL -XL) 100 MG 24 hr tablet, Take 1 tablet (100 mg total) by mouth daily. Take with or immediately following a meal., Disp: 90 tablet, Rfl: 3   rosuvastatin  (CRESTOR ) 40 MG tablet, Take 1 tablet (40 mg total) by mouth daily., Disp: 90 tablet, Rfl: 3      10/03/2024    8:44 AM 08/21/2024   10:14 AM 07/11/2024    9:55 AM  Depression screen PHQ 2/9  Decreased Interest 0 0 0  Down, Depressed, Hopeless 0 0 0  PHQ - 2 Score 0 0 0  Altered sleeping 0 0 0  Tired,  decreased energy 2 3 3   Change in appetite 1 2 1   Feeling bad or failure about yourself  0 1 0  Trouble concentrating 0 0 0  Moving slowly or fidgety/restless 0 0 0  Suicidal thoughts 0 0 0  PHQ-9 Score 3 6  4    Difficult doing work/chores   Extremely dIfficult     Data saved with a previous flowsheet row definition        10/03/2024    8:44 AM 08/21/2024   10:14 AM 07/11/2024    9:56 AM  GAD 7 : Generalized Anxiety Score  Nervous, Anxious, on Edge 0 0 0  Control/stop worrying 0 0 0  Worry too much - different things 0 0 0  Trouble relaxing 0 0 0  Restless 0 0 0  Easily annoyed or irritable 0 1 0  Afraid - awful might happen 0 0 0  Total GAD 7 Score 0 1 0  Anxiety Difficulty   Not difficult at all      Mental Health score  Negative Follow up:  n/a   Upstream - 10/03/24 9156       Pregnancy Intention Screening   Does the  patient want to become pregnant in the next year? No    Does the patient's partner want to become pregnant in the next year? No    Would the patient like to discuss contraceptive options today? No      Contraception Wrap Up   Current Method Abstinence    End Method Abstinence          The pregnancy intention screening data noted above was reviewed.   History of Present Illness: Here for pap and physical.  Uses abstinence for [facility]. Nexplanon taken out a month ago, had a regular menses last week.  Eczema has improved.  Had MI 2 years ago, stopped smoking soon after.  Smoked off and on throughout life, but not a cumulative of 20 years.  PCP and cardiologist manage chronic health problems.  Hasn't had a mammogram since age 55. .    Prior cytology: none available.  Last pap about 8 years ago   Review of Systems   Patient denies any headaches, blurred vision, shortness of breath, chest pain, abdominal pain, problems with bowel movements, urination  Physical Exam: General:  Well developed, well nourished, no acute distress Skin:  Warm and dry Neck:  Midline trachea Lungs; Clear to auscultation bilaterally Breast:  No dominant palpable mass, retraction, or nipple discharge Cardiovascular: Regular rate and rhythm Abdomen:  Soft, non tender, no hepatosplenomegaly Pelvic:  External genitalia is normal in appearance.  The vagina is normal in appearance.  The cervix is bulbous.  Uterus is felt to be normal size, shape, and contour.  No adnexal masses or tenderness noted. Exam limited by habitus  Extremities:  No swelling or varicosities noted  Chaperone: Aleck Blase  Impression: Normal GYN exam .    Plan: If pap normal, repeat in 3 years  Mammogram asap (# provided)

## 2024-10-04 LAB — CYTOLOGY - PAP
Comment: NEGATIVE
Diagnosis: NEGATIVE
High risk HPV: NEGATIVE

## 2024-10-23 ENCOUNTER — Other Ambulatory Visit (HOSPITAL_COMMUNITY): Payer: Self-pay

## 2024-10-23 ENCOUNTER — Other Ambulatory Visit: Payer: Self-pay

## 2024-11-04 ENCOUNTER — Other Ambulatory Visit (HOSPITAL_COMMUNITY): Payer: Self-pay

## 2024-11-19 ENCOUNTER — Other Ambulatory Visit: Payer: Self-pay | Admitting: Cardiology

## 2024-11-19 ENCOUNTER — Telehealth: Payer: Self-pay | Admitting: Cardiology

## 2024-11-19 NOTE — Telephone Encounter (Signed)
" °*  STAT* If patient is at the pharmacy, call can be transferred to refill team.   1. Which medications need to be refilled? (please list name of each medication and dose if known)   rosuvastatin  (CRESTOR ) 40 MG tablet    2. Which pharmacy/location (including street and city if local pharmacy) is medication to be sent to?  [facility] [phone] - [place], [place] - [address] AT [facility] [address]      3. Do they need a 30 day or 90 day supply? 90 day    Pt is out of medication  "

## 2024-11-26 ENCOUNTER — Other Ambulatory Visit: Payer: Self-pay | Admitting: Cardiology

## 2024-11-26 NOTE — Telephone Encounter (Signed)
 Lipids completed on 06/10/24

## 2024-11-27 ENCOUNTER — Telehealth: Payer: Self-pay | Admitting: Cardiology

## 2024-11-27 MED ORDER — METOPROLOL SUCCINATE ER 100 MG PO TB24: 100.0000 mg | ORAL_TABLET | Freq: Every day | ORAL | 2 refills | Status: AC

## 2024-11-27 MED ORDER — AMLODIPINE BESYLATE 10 MG PO TABS: 10.0000 mg | ORAL_TABLET | Freq: Every day | ORAL | 2 refills | Status: AC

## 2024-11-27 NOTE — Telephone Encounter (Signed)
" °*  STAT* If patient is at the pharmacy, call can be transferred to refill team.   1. Which medications need to be refilled? (please list name of each medication and dose if known)   amLODipine  (NORVASC ) 10 MG tablet    metoprolol  succinate (TOPROL -XL) 100 MG 24 hr tablet   rosuvastatin  (CRESTOR ) 40 MG tablet   2. Which pharmacy/location (including street and city if local pharmacy) is medication to be sent to?  [facility] [phone] - [place], [place] - [address] AT [facility] [address]    3. Do they need a 30 day or 90 day supply? 90  Patient is out of medication  "

## 2024-11-27 NOTE — Telephone Encounter (Signed)
 Looks like refills have already been sent in today.  I called pt and scheduled her appointment that was due.

## 2025-01-17 ENCOUNTER — Ambulatory Visit: Admitting: Cardiology

## 2025-02-17 ENCOUNTER — Ambulatory Visit: Admitting: Physician Assistant
